# Patient Record
Sex: Female | Born: 1945
Health system: Southern US, Community
[De-identification: ages and names within clinical notes are randomized; demographics above are authoritative.]

## PROBLEM LIST (undated history)

## (undated) DIAGNOSIS — I10 Essential (primary) hypertension: Secondary | ICD-10-CM

## (undated) DIAGNOSIS — E039 Hypothyroidism, unspecified: Secondary | ICD-10-CM

## (undated) DIAGNOSIS — Z9289 Personal history of other medical treatment: Secondary | ICD-10-CM

## (undated) DIAGNOSIS — E7800 Pure hypercholesterolemia, unspecified: Secondary | ICD-10-CM

## (undated) HISTORY — DX: Hypothyroidism, unspecified: E03.9

## (undated) HISTORY — DX: Essential (primary) hypertension: I10

## (undated) HISTORY — DX: Pure hypercholesterolemia, unspecified: E78.00

## (undated) HISTORY — DX: Personal history of other medical treatment: Z92.89

## (undated) HISTORY — PX: OTHER SURGICAL HISTORY: SHX169

---

## 1999-02-18 ENCOUNTER — Other Ambulatory Visit: Admission: RE | Admit: 1999-02-18 | Discharge: 1999-02-18 | Payer: Self-pay | Admitting: *Deleted

## 2000-03-08 ENCOUNTER — Other Ambulatory Visit: Admission: RE | Admit: 2000-03-08 | Discharge: 2000-03-08 | Payer: Self-pay | Admitting: *Deleted

## 2001-03-28 ENCOUNTER — Other Ambulatory Visit: Admission: RE | Admit: 2001-03-28 | Discharge: 2001-03-28 | Payer: Self-pay | Admitting: *Deleted

## 2002-04-16 ENCOUNTER — Other Ambulatory Visit: Admission: RE | Admit: 2002-04-16 | Discharge: 2002-04-16 | Payer: Self-pay | Admitting: *Deleted

## 2003-11-11 ENCOUNTER — Other Ambulatory Visit: Admission: RE | Admit: 2003-11-11 | Discharge: 2003-11-11 | Payer: Self-pay | Admitting: *Deleted

## 2004-02-22 ENCOUNTER — Encounter: Admission: RE | Admit: 2004-02-22 | Discharge: 2004-02-22 | Payer: Self-pay | Admitting: Family Medicine

## 2004-11-22 ENCOUNTER — Other Ambulatory Visit: Admission: RE | Admit: 2004-11-22 | Discharge: 2004-11-22 | Payer: Self-pay | Admitting: *Deleted

## 2005-03-03 ENCOUNTER — Encounter: Admission: RE | Admit: 2005-03-03 | Discharge: 2005-03-03 | Payer: Self-pay | Admitting: Family Medicine

## 2006-11-19 IMAGING — CT CT CHEST W/ CM
1 series · 16 of 31 positions shown, 20 images · IV contrast (omnipaque 75cc)
Comparison: None.

CLINICAL DATA: Cough/shortness of breath with exertion.  
 CT CHEST W/CONTRAST:
TECHNIQUE: Multidetector CT imaging of the chest was performed following the standard protocol during bolus administration of intravenous contrast. 
 Contrast:  75cc Omnipaque 300.

[Series 2: — · axial · 0.70mm/px · z∈[-261,+14]mm · 16 of 61 slices shown, 20 images]
[im 3/61  mediastinal]
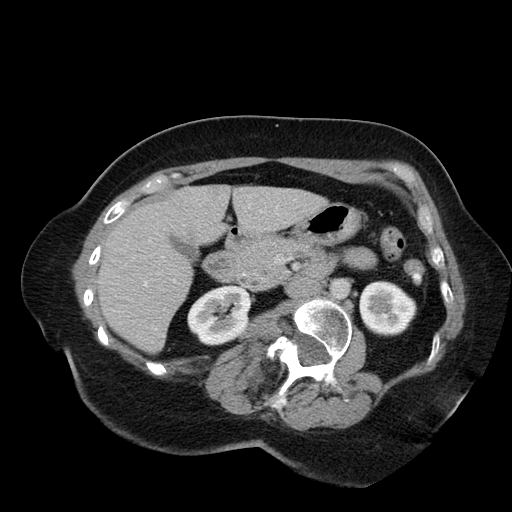
[im 3/61  lung]
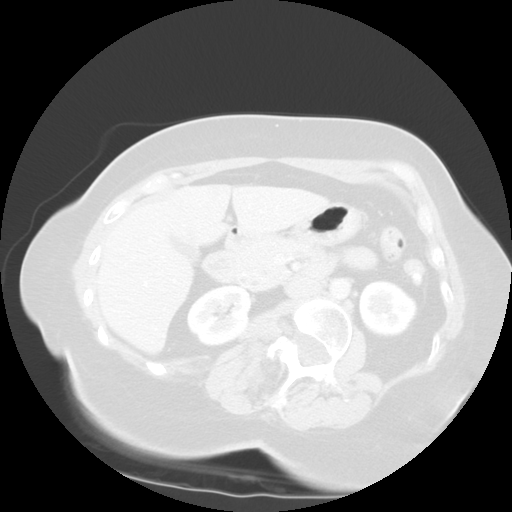
[im 7/61  lung]
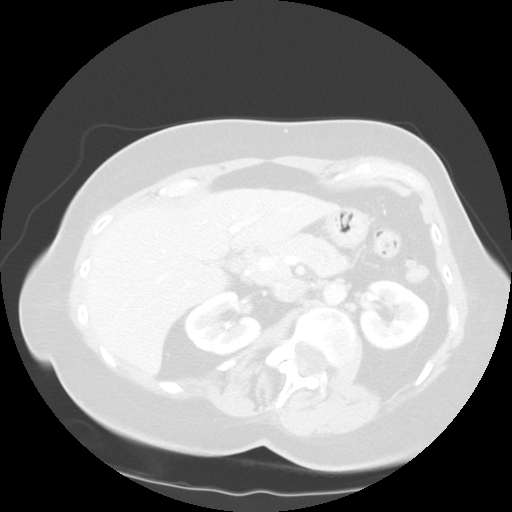
[im 12/61  lung]
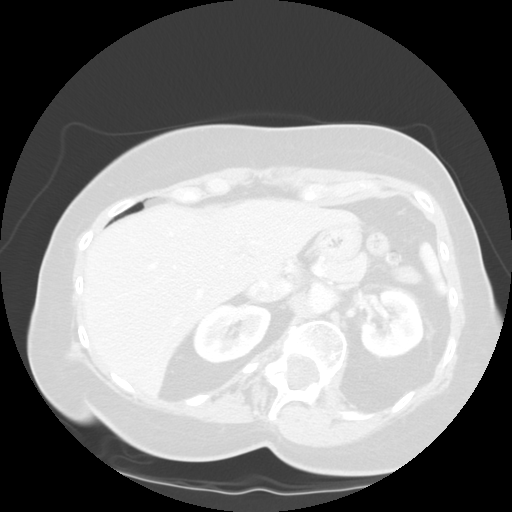
[im 14/61  lung]
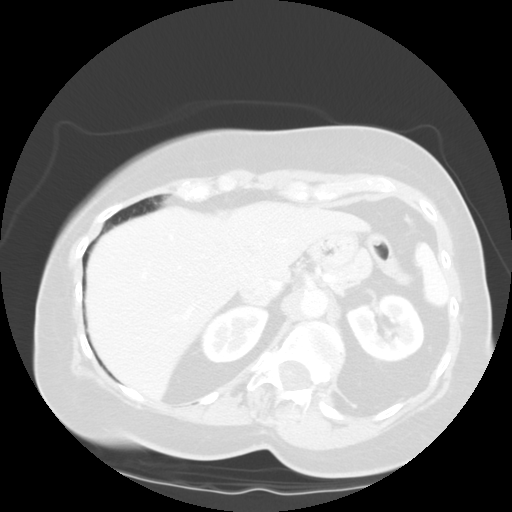
[im 18/61  mediastinal]
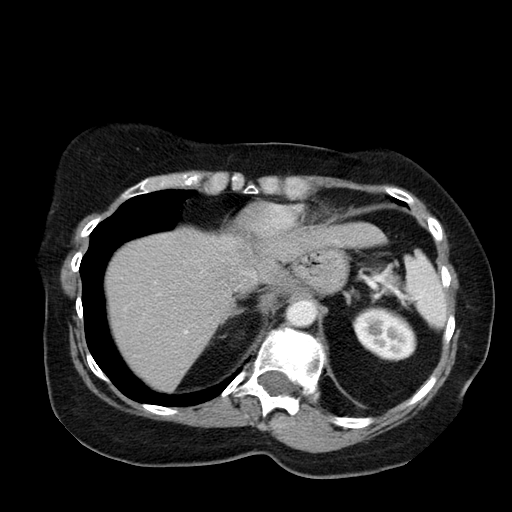
[im 18/61  lung]
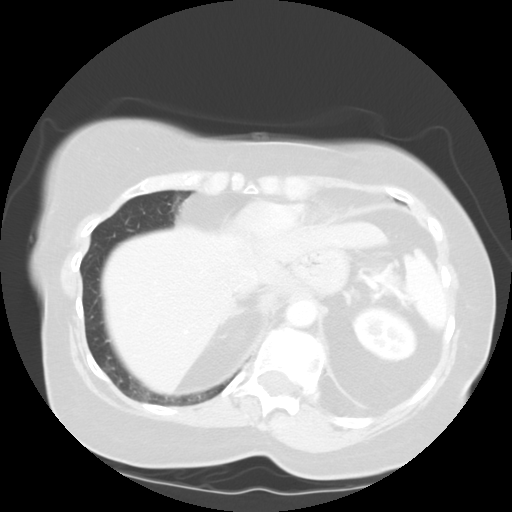
[im 21/61  lung]
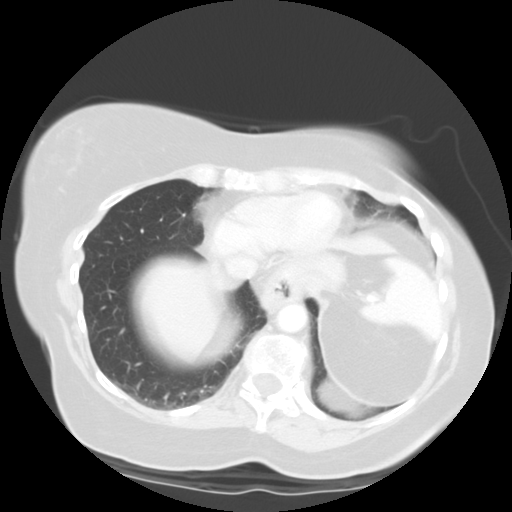
[im 25/61  lung]
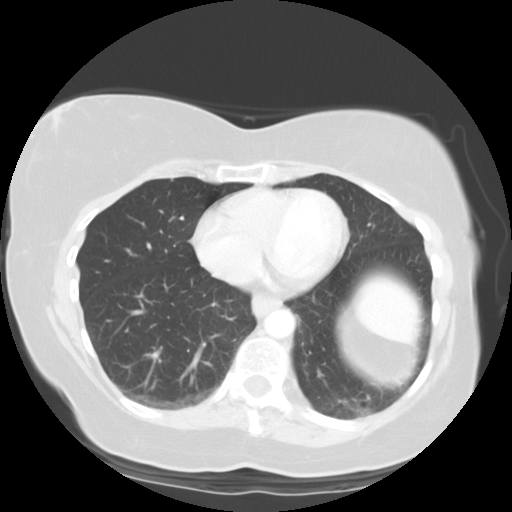
[im 29/61  lung]
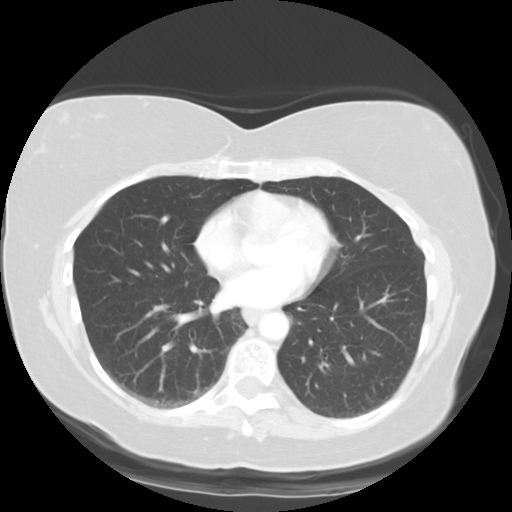
[im 33/61  mediastinal]
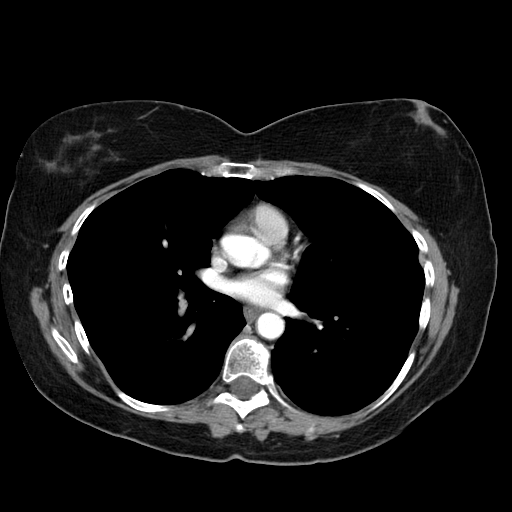
[im 33/61  lung]
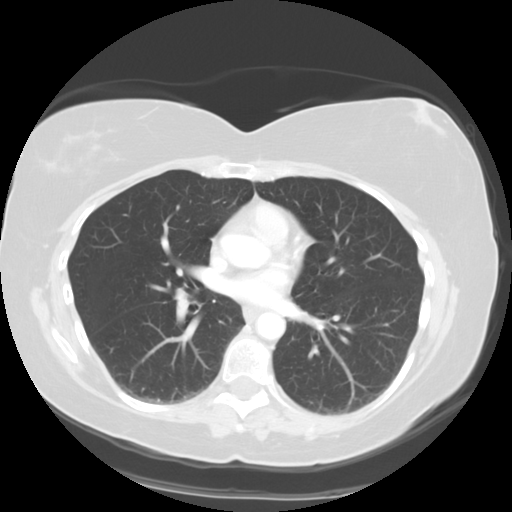
[im 36/61  lung]
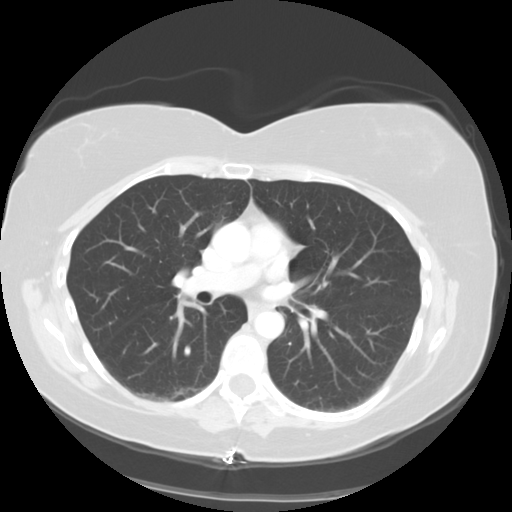
[im 38/61  lung]
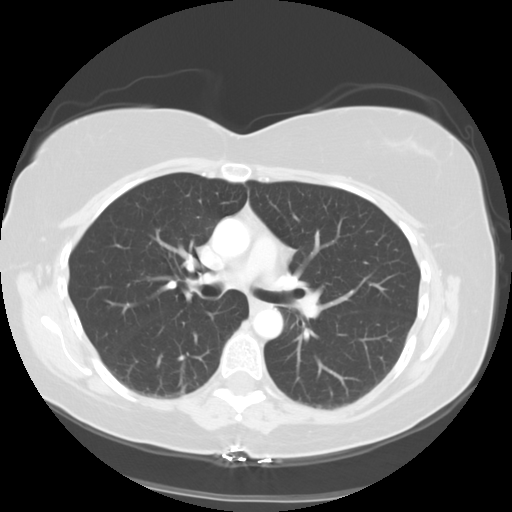
[im 41/61  lung]
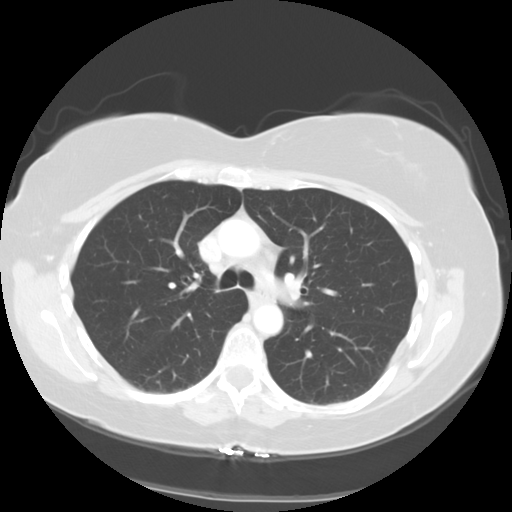
[im 45/61  mediastinal]
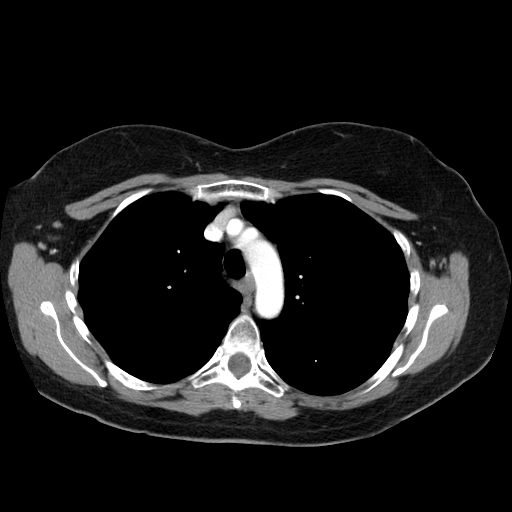
[im 45/61  lung]
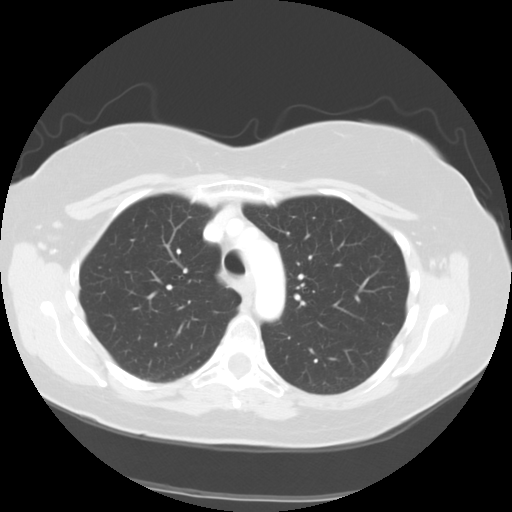
[im 49/61  lung]
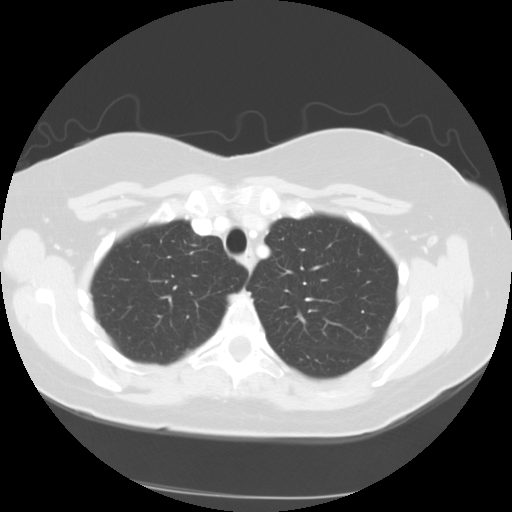
[im 54/61  lung]
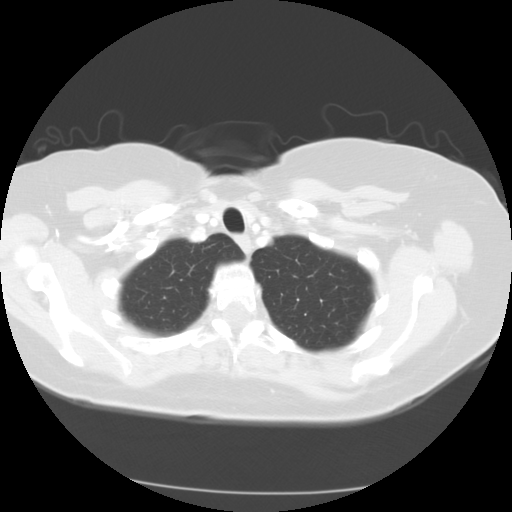
[im 58/61  lung]
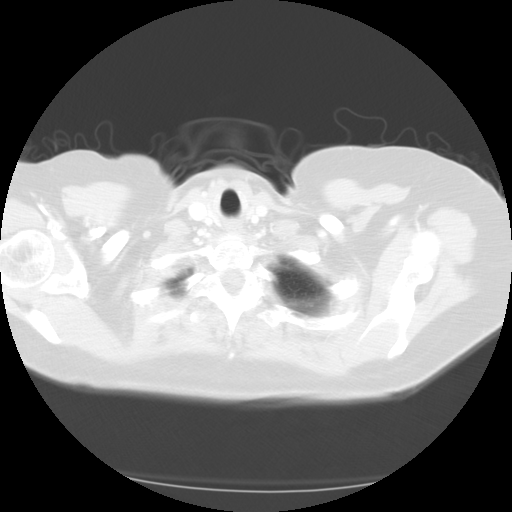

[16 of 31 positions shown; findings below may reference images not displayed]

FINDINGS: No active appearing lung infiltrates, nodules, or masses.  There is an elliptical fatty density posterior to the left hemidiaphragm at the left lung base.  This is likely a lipoma emanating off the diaphragm.  It is of homogeneous fat density and so I do not think it is significant.  
 There does appear to be some mild peribronchial thickening suggesting bronchitis. 
 No mediastinal adenopathy.  No pleural or pericardial fluid.
IMPRESSION: 1.  Mild bronchitic changes with no active air space disease or pleural fluid.  
 2.  Probable incidental lipoma emanating off the left hemidiaphragm.

## 2010-04-02 ENCOUNTER — Encounter: Payer: Self-pay | Admitting: Family Medicine

## 2011-10-31 DIAGNOSIS — Z1231 Encounter for screening mammogram for malignant neoplasm of breast: Secondary | ICD-10-CM | POA: Diagnosis not present

## 2011-11-20 DIAGNOSIS — E785 Hyperlipidemia, unspecified: Secondary | ICD-10-CM | POA: Diagnosis not present

## 2011-11-20 DIAGNOSIS — E039 Hypothyroidism, unspecified: Secondary | ICD-10-CM | POA: Diagnosis not present

## 2011-11-20 DIAGNOSIS — Z0000 Encounter for general adult medical examination without abnormal findings: Secondary | ICD-10-CM | POA: Diagnosis not present

## 2011-11-20 DIAGNOSIS — I10 Essential (primary) hypertension: Secondary | ICD-10-CM | POA: Diagnosis not present

## 2011-11-20 DIAGNOSIS — Z23 Encounter for immunization: Secondary | ICD-10-CM | POA: Diagnosis not present

## 2011-12-06 DIAGNOSIS — M25569 Pain in unspecified knee: Secondary | ICD-10-CM | POA: Diagnosis not present

## 2011-12-06 DIAGNOSIS — M47817 Spondylosis without myelopathy or radiculopathy, lumbosacral region: Secondary | ICD-10-CM | POA: Diagnosis not present

## 2011-12-06 DIAGNOSIS — M4120 Other idiopathic scoliosis, site unspecified: Secondary | ICD-10-CM | POA: Diagnosis not present

## 2011-12-06 DIAGNOSIS — M1610 Unilateral primary osteoarthritis, unspecified hip: Secondary | ICD-10-CM | POA: Diagnosis not present

## 2011-12-20 DIAGNOSIS — M1610 Unilateral primary osteoarthritis, unspecified hip: Secondary | ICD-10-CM | POA: Diagnosis not present

## 2012-01-17 DIAGNOSIS — D1801 Hemangioma of skin and subcutaneous tissue: Secondary | ICD-10-CM | POA: Diagnosis not present

## 2012-01-17 DIAGNOSIS — L821 Other seborrheic keratosis: Secondary | ICD-10-CM | POA: Diagnosis not present

## 2012-03-01 DIAGNOSIS — Z961 Presence of intraocular lens: Secondary | ICD-10-CM | POA: Diagnosis not present

## 2012-03-01 DIAGNOSIS — H26499 Other secondary cataract, unspecified eye: Secondary | ICD-10-CM | POA: Diagnosis not present

## 2012-03-01 DIAGNOSIS — H04129 Dry eye syndrome of unspecified lacrimal gland: Secondary | ICD-10-CM | POA: Diagnosis not present

## 2012-04-01 DIAGNOSIS — J069 Acute upper respiratory infection, unspecified: Secondary | ICD-10-CM | POA: Diagnosis not present

## 2012-04-01 DIAGNOSIS — J029 Acute pharyngitis, unspecified: Secondary | ICD-10-CM | POA: Diagnosis not present

## 2012-04-06 DIAGNOSIS — J209 Acute bronchitis, unspecified: Secondary | ICD-10-CM | POA: Diagnosis not present

## 2012-11-04 DIAGNOSIS — Z1231 Encounter for screening mammogram for malignant neoplasm of breast: Secondary | ICD-10-CM | POA: Diagnosis not present

## 2012-11-21 DIAGNOSIS — Z0000 Encounter for general adult medical examination without abnormal findings: Secondary | ICD-10-CM | POA: Diagnosis not present

## 2012-11-21 DIAGNOSIS — M899 Disorder of bone, unspecified: Secondary | ICD-10-CM | POA: Diagnosis not present

## 2012-11-21 DIAGNOSIS — E039 Hypothyroidism, unspecified: Secondary | ICD-10-CM | POA: Diagnosis not present

## 2012-11-21 DIAGNOSIS — E785 Hyperlipidemia, unspecified: Secondary | ICD-10-CM | POA: Diagnosis not present

## 2012-11-28 DIAGNOSIS — Z23 Encounter for immunization: Secondary | ICD-10-CM | POA: Diagnosis not present

## 2012-11-28 DIAGNOSIS — E785 Hyperlipidemia, unspecified: Secondary | ICD-10-CM | POA: Diagnosis not present

## 2012-11-28 DIAGNOSIS — M899 Disorder of bone, unspecified: Secondary | ICD-10-CM | POA: Diagnosis not present

## 2012-11-28 DIAGNOSIS — E039 Hypothyroidism, unspecified: Secondary | ICD-10-CM | POA: Diagnosis not present

## 2012-11-28 DIAGNOSIS — M8580 Other specified disorders of bone density and structure, unspecified site: Secondary | ICD-10-CM | POA: Insufficient documentation

## 2012-11-28 DIAGNOSIS — I10 Essential (primary) hypertension: Secondary | ICD-10-CM | POA: Diagnosis not present

## 2012-11-28 DIAGNOSIS — Z0000 Encounter for general adult medical examination without abnormal findings: Secondary | ICD-10-CM | POA: Diagnosis not present

## 2013-01-31 DIAGNOSIS — Z8262 Family history of osteoporosis: Secondary | ICD-10-CM | POA: Diagnosis not present

## 2013-01-31 DIAGNOSIS — M899 Disorder of bone, unspecified: Secondary | ICD-10-CM | POA: Diagnosis not present

## 2013-05-13 DIAGNOSIS — J309 Allergic rhinitis, unspecified: Secondary | ICD-10-CM | POA: Diagnosis not present

## 2013-05-13 DIAGNOSIS — J320 Chronic maxillary sinusitis: Secondary | ICD-10-CM | POA: Diagnosis not present

## 2013-10-06 DIAGNOSIS — J209 Acute bronchitis, unspecified: Secondary | ICD-10-CM | POA: Diagnosis not present

## 2013-11-06 DIAGNOSIS — Z1231 Encounter for screening mammogram for malignant neoplasm of breast: Secondary | ICD-10-CM | POA: Diagnosis not present

## 2013-11-28 DIAGNOSIS — I10 Essential (primary) hypertension: Secondary | ICD-10-CM | POA: Diagnosis not present

## 2013-11-28 DIAGNOSIS — E039 Hypothyroidism, unspecified: Secondary | ICD-10-CM | POA: Diagnosis not present

## 2013-11-28 DIAGNOSIS — E559 Vitamin D deficiency, unspecified: Secondary | ICD-10-CM | POA: Diagnosis not present

## 2013-11-28 DIAGNOSIS — E785 Hyperlipidemia, unspecified: Secondary | ICD-10-CM | POA: Diagnosis not present

## 2013-11-28 DIAGNOSIS — Z23 Encounter for immunization: Secondary | ICD-10-CM | POA: Diagnosis not present

## 2014-04-13 DIAGNOSIS — J209 Acute bronchitis, unspecified: Secondary | ICD-10-CM | POA: Diagnosis not present

## 2014-05-29 DIAGNOSIS — R8290 Unspecified abnormal findings in urine: Secondary | ICD-10-CM | POA: Diagnosis not present

## 2014-05-29 DIAGNOSIS — I10 Essential (primary) hypertension: Secondary | ICD-10-CM | POA: Diagnosis not present

## 2014-05-29 DIAGNOSIS — E785 Hyperlipidemia, unspecified: Secondary | ICD-10-CM | POA: Diagnosis not present

## 2014-05-29 DIAGNOSIS — E039 Hypothyroidism, unspecified: Secondary | ICD-10-CM | POA: Diagnosis not present

## 2014-05-29 DIAGNOSIS — E559 Vitamin D deficiency, unspecified: Secondary | ICD-10-CM | POA: Diagnosis not present

## 2014-05-29 DIAGNOSIS — Z1211 Encounter for screening for malignant neoplasm of colon: Secondary | ICD-10-CM | POA: Diagnosis not present

## 2014-05-29 DIAGNOSIS — Z0000 Encounter for general adult medical examination without abnormal findings: Secondary | ICD-10-CM | POA: Diagnosis not present

## 2014-11-13 DIAGNOSIS — Z1231 Encounter for screening mammogram for malignant neoplasm of breast: Secondary | ICD-10-CM | POA: Diagnosis not present

## 2014-12-22 DIAGNOSIS — Z23 Encounter for immunization: Secondary | ICD-10-CM | POA: Diagnosis not present

## 2015-02-15 DIAGNOSIS — M85852 Other specified disorders of bone density and structure, left thigh: Secondary | ICD-10-CM | POA: Diagnosis not present

## 2015-06-03 DIAGNOSIS — E039 Hypothyroidism, unspecified: Secondary | ICD-10-CM | POA: Diagnosis not present

## 2015-06-03 DIAGNOSIS — G479 Sleep disorder, unspecified: Secondary | ICD-10-CM | POA: Diagnosis not present

## 2015-06-03 DIAGNOSIS — M2550 Pain in unspecified joint: Secondary | ICD-10-CM | POA: Diagnosis not present

## 2015-06-03 DIAGNOSIS — I10 Essential (primary) hypertension: Secondary | ICD-10-CM | POA: Diagnosis not present

## 2015-06-03 DIAGNOSIS — E785 Hyperlipidemia, unspecified: Secondary | ICD-10-CM | POA: Diagnosis not present

## 2015-06-03 DIAGNOSIS — R5383 Other fatigue: Secondary | ICD-10-CM | POA: Diagnosis not present

## 2015-06-03 DIAGNOSIS — Z0000 Encounter for general adult medical examination without abnormal findings: Secondary | ICD-10-CM | POA: Diagnosis not present

## 2015-06-03 DIAGNOSIS — M8580 Other specified disorders of bone density and structure, unspecified site: Secondary | ICD-10-CM | POA: Diagnosis not present

## 2015-06-03 DIAGNOSIS — E559 Vitamin D deficiency, unspecified: Secondary | ICD-10-CM | POA: Diagnosis not present

## 2015-11-04 DIAGNOSIS — H524 Presbyopia: Secondary | ICD-10-CM | POA: Diagnosis not present

## 2015-11-04 DIAGNOSIS — H26493 Other secondary cataract, bilateral: Secondary | ICD-10-CM | POA: Diagnosis not present

## 2015-11-04 DIAGNOSIS — Z961 Presence of intraocular lens: Secondary | ICD-10-CM | POA: Diagnosis not present

## 2015-11-04 DIAGNOSIS — H04129 Dry eye syndrome of unspecified lacrimal gland: Secondary | ICD-10-CM | POA: Diagnosis not present

## 2015-11-19 DIAGNOSIS — Z1231 Encounter for screening mammogram for malignant neoplasm of breast: Secondary | ICD-10-CM | POA: Diagnosis not present

## 2015-11-19 DIAGNOSIS — Z803 Family history of malignant neoplasm of breast: Secondary | ICD-10-CM | POA: Diagnosis not present

## 2016-06-08 DIAGNOSIS — E785 Hyperlipidemia, unspecified: Secondary | ICD-10-CM | POA: Diagnosis not present

## 2016-06-08 DIAGNOSIS — E559 Vitamin D deficiency, unspecified: Secondary | ICD-10-CM | POA: Diagnosis not present

## 2016-06-08 DIAGNOSIS — Z0000 Encounter for general adult medical examination without abnormal findings: Secondary | ICD-10-CM | POA: Diagnosis not present

## 2016-06-08 DIAGNOSIS — W57XXXA Bitten or stung by nonvenomous insect and other nonvenomous arthropods, initial encounter: Secondary | ICD-10-CM | POA: Diagnosis not present

## 2016-06-08 DIAGNOSIS — R5383 Other fatigue: Secondary | ICD-10-CM | POA: Diagnosis not present

## 2016-06-08 DIAGNOSIS — E039 Hypothyroidism, unspecified: Secondary | ICD-10-CM | POA: Diagnosis not present

## 2016-06-08 DIAGNOSIS — I10 Essential (primary) hypertension: Secondary | ICD-10-CM | POA: Diagnosis not present

## 2016-06-08 DIAGNOSIS — M8580 Other specified disorders of bone density and structure, unspecified site: Secondary | ICD-10-CM | POA: Diagnosis not present

## 2016-06-08 DIAGNOSIS — M2550 Pain in unspecified joint: Secondary | ICD-10-CM | POA: Diagnosis not present

## 2016-08-11 DIAGNOSIS — M7752 Other enthesopathy of left foot: Secondary | ICD-10-CM | POA: Diagnosis not present

## 2016-08-11 DIAGNOSIS — M7751 Other enthesopathy of right foot: Secondary | ICD-10-CM | POA: Diagnosis not present

## 2016-08-11 DIAGNOSIS — G5761 Lesion of plantar nerve, right lower limb: Secondary | ICD-10-CM | POA: Diagnosis not present

## 2016-08-11 DIAGNOSIS — M19071 Primary osteoarthritis, right ankle and foot: Secondary | ICD-10-CM | POA: Diagnosis not present

## 2016-08-11 DIAGNOSIS — G5762 Lesion of plantar nerve, left lower limb: Secondary | ICD-10-CM | POA: Diagnosis not present

## 2016-08-11 DIAGNOSIS — M19072 Primary osteoarthritis, left ankle and foot: Secondary | ICD-10-CM | POA: Diagnosis not present

## 2016-08-17 DIAGNOSIS — R112 Nausea with vomiting, unspecified: Secondary | ICD-10-CM | POA: Diagnosis not present

## 2016-08-21 DIAGNOSIS — G5762 Lesion of plantar nerve, left lower limb: Secondary | ICD-10-CM | POA: Diagnosis not present

## 2016-08-21 DIAGNOSIS — G5761 Lesion of plantar nerve, right lower limb: Secondary | ICD-10-CM | POA: Diagnosis not present

## 2016-08-21 DIAGNOSIS — M7752 Other enthesopathy of left foot: Secondary | ICD-10-CM | POA: Diagnosis not present

## 2016-08-21 DIAGNOSIS — M7751 Other enthesopathy of right foot: Secondary | ICD-10-CM | POA: Diagnosis not present

## 2016-08-28 DIAGNOSIS — M7752 Other enthesopathy of left foot: Secondary | ICD-10-CM | POA: Diagnosis not present

## 2016-08-28 DIAGNOSIS — G5762 Lesion of plantar nerve, left lower limb: Secondary | ICD-10-CM | POA: Diagnosis not present

## 2016-08-28 DIAGNOSIS — G5761 Lesion of plantar nerve, right lower limb: Secondary | ICD-10-CM | POA: Diagnosis not present

## 2016-08-28 DIAGNOSIS — M7751 Other enthesopathy of right foot: Secondary | ICD-10-CM | POA: Diagnosis not present

## 2016-09-11 DIAGNOSIS — G5760 Lesion of plantar nerve, unspecified lower limb: Secondary | ICD-10-CM | POA: Diagnosis not present

## 2016-09-11 DIAGNOSIS — M7751 Other enthesopathy of right foot: Secondary | ICD-10-CM | POA: Diagnosis not present

## 2016-09-11 DIAGNOSIS — M7752 Other enthesopathy of left foot: Secondary | ICD-10-CM | POA: Diagnosis not present

## 2016-09-21 ENCOUNTER — Encounter: Payer: Self-pay | Admitting: Podiatry

## 2016-09-21 ENCOUNTER — Ambulatory Visit (INDEPENDENT_AMBULATORY_CARE_PROVIDER_SITE_OTHER): Payer: PPO | Admitting: Podiatry

## 2016-09-21 VITALS — BP 154/78 | HR 96 | Ht 62.0 in | Wt 155.0 lb

## 2016-09-21 DIAGNOSIS — M2010 Hallux valgus (acquired), unspecified foot: Secondary | ICD-10-CM | POA: Diagnosis not present

## 2016-09-21 DIAGNOSIS — M21619 Bunion of unspecified foot: Secondary | ICD-10-CM

## 2016-09-21 DIAGNOSIS — L97501 Non-pressure chronic ulcer of other part of unspecified foot limited to breakdown of skin: Secondary | ICD-10-CM

## 2016-09-21 NOTE — Patient Instructions (Signed)
Seen for painful bunion on left foot. Reviewed findings and available treatment options. May benefit from [facility] and hammer toe surgery 2nd left. All lesions, pre ulcerative lesion 2nd right, and under the ball of left foot debrided and padded. Return as needed.

## 2016-09-21 NOTE — Progress Notes (Signed)
SUBJECTIVE: 71 y.o. year old female presents with painful bunion L>R. Pain for the last 2 months. Had bunion for many years. Was doing well until last 2 months ago. Been treated with injection for Neuroma pain on left foot.  REVIEW OF SYSTEMS: Pertinent items noted in HPI and remainder of comprehensive ROS otherwise negative.  OBJECTIVE: DERMATOLOGIC EXAMINATION: Thick discolored uneven nails x 10. Ulcerating digital corn 2nd IPJ medial right foot. Preulcerative callus over the left foot bunion. Severe plantar porokeratotic lesions under ball of left foot.   VASCULAR EXAMINATION OF LOWER LIMBS: All pedal pulses are palpable with normal pulsation.  Capillary Filling times within 3 seconds in all digits.  No edema or erythema noted. Temperature gradient from tibial crest to dorsum of foot is within normal bilateral.  NEUROLOGIC EXAMINATION OF THE LOWER LIMBS: All epicritic and tactile sensations grossly intact. Sharp and Dull discriminatory sensations at the plantar ball of hallux is intact bilateral.   MUSCULOSKELETAL EXAMINATION: Positive for severe hallux valgus with bunion deformity Left with pre ulcerative skin breakdown over the bunion area. Subluxed first MPJ left foot. Severe digital contracture 2nd left that overlaps with the great toe. Mild bunion deformity right.  RADIOGRAPHIC STUDIES from Dr. Kerri Perches show:  AP View:  Generalized, loss of bone mass in forefoot and rearfoot.  Severe valgus rotation of the great toe, subluxed first MPJ with severe medial deviation of the first metatarsal bone. Overlapping first and 2nd digits. Lateral view:  Displaced first ray, severe forefoot digital deformities. No gross deviation of rearfoot.   ASSESSMENT: Severe HAV with bunion, with ulcerating skin left foot. Ulcerating digital corn 2nd right. Mycotic nails x 10. Porokeratosis plantar left foot. Pain with ambulation left.  PLAN: Reviewed findings and available treatment  options, shoe with opening over bunion area, and surgical option without guarantee of success. All lesions debrided padded, 2nd right, over bunion left. All nails debrided.  Patient will return when she is ready to have surgery on left bunion and deformed 2nd toe.

## 2016-09-25 DIAGNOSIS — I10 Essential (primary) hypertension: Secondary | ICD-10-CM | POA: Diagnosis not present

## 2016-09-25 DIAGNOSIS — M79671 Pain in right foot: Secondary | ICD-10-CM | POA: Diagnosis not present

## 2016-09-25 DIAGNOSIS — B353 Tinea pedis: Secondary | ICD-10-CM | POA: Diagnosis not present

## 2016-09-25 DIAGNOSIS — M2010 Hallux valgus (acquired), unspecified foot: Secondary | ICD-10-CM | POA: Diagnosis not present

## 2016-09-25 DIAGNOSIS — M2042 Other hammer toe(s) (acquired), left foot: Secondary | ICD-10-CM | POA: Diagnosis not present

## 2016-10-12 ENCOUNTER — Ambulatory Visit (INDEPENDENT_AMBULATORY_CARE_PROVIDER_SITE_OTHER): Payer: PPO

## 2016-10-12 ENCOUNTER — Encounter: Payer: Self-pay | Admitting: Podiatry

## 2016-10-12 ENCOUNTER — Ambulatory Visit (INDEPENDENT_AMBULATORY_CARE_PROVIDER_SITE_OTHER): Payer: PPO | Admitting: Podiatry

## 2016-10-12 DIAGNOSIS — E039 Hypothyroidism, unspecified: Secondary | ICD-10-CM | POA: Insufficient documentation

## 2016-10-12 DIAGNOSIS — M2010 Hallux valgus (acquired), unspecified foot: Secondary | ICD-10-CM

## 2016-10-12 DIAGNOSIS — E785 Hyperlipidemia, unspecified: Secondary | ICD-10-CM | POA: Insufficient documentation

## 2016-10-12 DIAGNOSIS — I10 Essential (primary) hypertension: Secondary | ICD-10-CM | POA: Insufficient documentation

## 2016-10-12 MED ORDER — DICLOFENAC SODIUM 1 % TD GEL: 4.0000 g | Freq: Four times a day (QID) | TRANSDERMAL | 3 refills | Status: DC

## 2016-10-12 NOTE — Progress Notes (Signed)
   Subjective:    Patient ID: Terri Mcgee, female    DOB: 1945/07/03, 71 y.o.   MRN: [id number]  HPI: She presents today chief complaint of a painful callus area to the lateral aspect of the fifth metatarsal head of the right foot. She states that wearing her shoes are uncomfortable causing this area and they're also painful with the bunion deformity on the contralateral foot. She states that her skin is thin.    Review of Systems  Musculoskeletal: Positive for arthralgias and back pain.  Skin: Positive for rash.  All other systems reviewed and are negative.      Objective:   Physical Exam: Vital signs are stable alert and oriented 3. Pulses are palpable. Neurologic exam is intact. Due to reflex are intact. Muscle strength is normal bilateral. Orthopedic evaluation demonstrates severe bunion deformity with overlapping hammertoe deformity rigid in nature with slight diminished vascular spine with elevation of the left foot. This does concern me about anyone performing any type of surgical procedure there were pulses are palpable is obvious that she has diminished Fill time and Flovent. The right foot however demonstrate erectus foot she does have tailor's bunion deformity with overlying reactive hyperkeratotic lesion. She has severe fat pad atrophy.        Assessment & Plan:  Assessment: Tailor's bunion deformity with callus lateral aspect of the fifth metatarsal right foot. Severe hammertoe deformity to be deformity left foot.  Plan: Discussed etiology pathology concerning for surgical therapies at this point I expressed to her that surgery should be not considered as a primary resort at this point in time I recommended highly that she consider simple debridement and padding. So we discussed this in detail today show extensive cement tool follow up with [place] as needed.

## 2016-11-06 DIAGNOSIS — Z961 Presence of intraocular lens: Secondary | ICD-10-CM | POA: Diagnosis not present

## 2016-11-06 DIAGNOSIS — H26493 Other secondary cataract, bilateral: Secondary | ICD-10-CM | POA: Diagnosis not present

## 2016-11-06 DIAGNOSIS — H524 Presbyopia: Secondary | ICD-10-CM | POA: Diagnosis not present

## 2016-11-06 DIAGNOSIS — H43393 Other vitreous opacities, bilateral: Secondary | ICD-10-CM | POA: Diagnosis not present

## 2017-06-12 DIAGNOSIS — E785 Hyperlipidemia, unspecified: Secondary | ICD-10-CM | POA: Diagnosis not present

## 2017-06-12 DIAGNOSIS — I10 Essential (primary) hypertension: Secondary | ICD-10-CM | POA: Diagnosis not present

## 2017-06-12 DIAGNOSIS — E039 Hypothyroidism, unspecified: Secondary | ICD-10-CM | POA: Diagnosis not present

## 2017-06-12 DIAGNOSIS — Z23 Encounter for immunization: Secondary | ICD-10-CM | POA: Diagnosis not present

## 2017-06-12 DIAGNOSIS — Z1159 Encounter for screening for other viral diseases: Secondary | ICD-10-CM | POA: Diagnosis not present

## 2017-06-12 DIAGNOSIS — Z0000 Encounter for general adult medical examination without abnormal findings: Secondary | ICD-10-CM | POA: Diagnosis not present

## 2017-06-12 DIAGNOSIS — Z1389 Encounter for screening for other disorder: Secondary | ICD-10-CM | POA: Diagnosis not present

## 2017-06-12 DIAGNOSIS — E663 Overweight: Secondary | ICD-10-CM | POA: Diagnosis not present

## 2017-06-12 DIAGNOSIS — Z1211 Encounter for screening for malignant neoplasm of colon: Secondary | ICD-10-CM | POA: Diagnosis not present

## 2017-06-12 DIAGNOSIS — M8580 Other specified disorders of bone density and structure, unspecified site: Secondary | ICD-10-CM | POA: Diagnosis not present

## 2017-06-18 DIAGNOSIS — Z1231 Encounter for screening mammogram for malignant neoplasm of breast: Secondary | ICD-10-CM | POA: Diagnosis not present

## 2018-02-13 DIAGNOSIS — H04129 Dry eye syndrome of unspecified lacrimal gland: Secondary | ICD-10-CM | POA: Diagnosis not present

## 2018-02-13 DIAGNOSIS — Z961 Presence of intraocular lens: Secondary | ICD-10-CM | POA: Diagnosis not present

## 2018-02-13 DIAGNOSIS — H43393 Other vitreous opacities, bilateral: Secondary | ICD-10-CM | POA: Diagnosis not present

## 2018-02-13 DIAGNOSIS — H26493 Other secondary cataract, bilateral: Secondary | ICD-10-CM | POA: Diagnosis not present

## 2018-04-03 DIAGNOSIS — J0100 Acute maxillary sinusitis, unspecified: Secondary | ICD-10-CM | POA: Diagnosis not present

## 2018-04-03 DIAGNOSIS — R05 Cough: Secondary | ICD-10-CM | POA: Diagnosis not present

## 2018-04-03 DIAGNOSIS — J040 Acute laryngitis: Secondary | ICD-10-CM | POA: Diagnosis not present

## 2018-07-10 DIAGNOSIS — M8580 Other specified disorders of bone density and structure, unspecified site: Secondary | ICD-10-CM | POA: Diagnosis not present

## 2018-07-10 DIAGNOSIS — E039 Hypothyroidism, unspecified: Secondary | ICD-10-CM | POA: Diagnosis not present

## 2018-07-10 DIAGNOSIS — I10 Essential (primary) hypertension: Secondary | ICD-10-CM | POA: Diagnosis not present

## 2018-07-10 DIAGNOSIS — E785 Hyperlipidemia, unspecified: Secondary | ICD-10-CM | POA: Diagnosis not present

## 2018-12-12 DIAGNOSIS — E039 Hypothyroidism, unspecified: Secondary | ICD-10-CM | POA: Diagnosis not present

## 2018-12-12 DIAGNOSIS — Z0000 Encounter for general adult medical examination without abnormal findings: Secondary | ICD-10-CM | POA: Diagnosis not present

## 2018-12-12 DIAGNOSIS — Z1211 Encounter for screening for malignant neoplasm of colon: Secondary | ICD-10-CM | POA: Diagnosis not present

## 2018-12-12 DIAGNOSIS — I10 Essential (primary) hypertension: Secondary | ICD-10-CM | POA: Diagnosis not present

## 2018-12-12 DIAGNOSIS — E785 Hyperlipidemia, unspecified: Secondary | ICD-10-CM | POA: Diagnosis not present

## 2019-03-14 DIAGNOSIS — Z9289 Personal history of other medical treatment: Secondary | ICD-10-CM

## 2019-03-14 HISTORY — DX: Personal history of other medical treatment: Z92.89

## 2019-03-20 DIAGNOSIS — H26493 Other secondary cataract, bilateral: Secondary | ICD-10-CM | POA: Diagnosis not present

## 2019-03-20 DIAGNOSIS — H43393 Other vitreous opacities, bilateral: Secondary | ICD-10-CM | POA: Diagnosis not present

## 2019-03-20 DIAGNOSIS — H1045 Other chronic allergic conjunctivitis: Secondary | ICD-10-CM | POA: Diagnosis not present

## 2019-03-20 DIAGNOSIS — H04123 Dry eye syndrome of bilateral lacrimal glands: Secondary | ICD-10-CM | POA: Diagnosis not present

## 2019-05-04 ENCOUNTER — Ambulatory Visit: Payer: PPO | Attending: Internal Medicine

## 2019-05-04 DIAGNOSIS — Z23 Encounter for immunization: Secondary | ICD-10-CM | POA: Insufficient documentation

## 2019-05-04 NOTE — Progress Notes (Signed)
   Covid-19 Vaccination Clinic  Name:  Terri Mcgee    MRN: [id number] DOB: 10-26-1945  05/04/2019  [name] was observed post Covid-19 immunization for 15 minutes without incidence. She was provided with Vaccine Information Sheet and instruction to access the V-Safe system.   [name] was instructed to call 911 with any severe reactions post vaccine: Marland Kitchen Difficulty breathing  . Swelling of your face and throat  . A fast heartbeat  . A bad rash all over your body  . Dizziness and weakness    Immunizations Administered    Name Date Dose VIS Date Route   Pfizer COVID-19 Vaccine 05/04/2019 11:58 AM 0.3 mL 02/21/2019 Intramuscular   Manufacturer: [place]   Lot: [id number]   [place]: [id number]

## 2019-05-28 ENCOUNTER — Ambulatory Visit: Payer: PPO | Attending: Internal Medicine

## 2019-05-28 DIAGNOSIS — Z23 Encounter for immunization: Secondary | ICD-10-CM

## 2019-05-28 NOTE — Progress Notes (Signed)
   Covid-19 Vaccination Clinic  Name:  Terri Mcgee    MRN: [id number] DOB: 26-May-1945  05/28/2019  Terri Mcgee was observed post Covid-19 immunization for 15 minutes without incident. She was provided with Vaccine Information Sheet and instruction to access the V-Safe system.   Terri Mcgee was instructed to call 911 with any severe reactions post vaccine: Marland Kitchen Difficulty breathing  . Swelling of face and throat  . A fast heartbeat  . A bad rash all over body  . Dizziness and weakness   Immunizations Administered    Name Date Dose VIS Date Route   Pfizer COVID-19 Vaccine 05/28/2019 10:47 AM 0.3 mL 02/21/2019 Intramuscular   Manufacturer: [place]   Lot: [id number]   [place]: [id number]

## 2019-12-15 DIAGNOSIS — Z1231 Encounter for screening mammogram for malignant neoplasm of breast: Secondary | ICD-10-CM | POA: Diagnosis not present

## 2020-02-07 DIAGNOSIS — S50862D Insect bite (nonvenomous) of left forearm, subsequent encounter: Secondary | ICD-10-CM | POA: Diagnosis not present

## 2020-03-16 ENCOUNTER — Ambulatory Visit (INDEPENDENT_AMBULATORY_CARE_PROVIDER_SITE_OTHER): Payer: PPO | Admitting: Orthopedic Surgery

## 2020-03-16 ENCOUNTER — Other Ambulatory Visit: Payer: Self-pay

## 2020-03-16 ENCOUNTER — Encounter: Payer: Self-pay | Admitting: Orthopedic Surgery

## 2020-03-16 VITALS — BP 128/76 | HR 78 | Temp 96.8°F | Ht 61.0 in | Wt 151.6 lb

## 2020-03-16 DIAGNOSIS — M25551 Pain in right hip: Secondary | ICD-10-CM | POA: Diagnosis not present

## 2020-03-16 DIAGNOSIS — F419 Anxiety disorder, unspecified: Secondary | ICD-10-CM | POA: Diagnosis not present

## 2020-03-16 DIAGNOSIS — F329 Major depressive disorder, single episode, unspecified: Secondary | ICD-10-CM | POA: Diagnosis not present

## 2020-03-16 DIAGNOSIS — E7800 Pure hypercholesterolemia, unspecified: Secondary | ICD-10-CM | POA: Diagnosis not present

## 2020-03-16 DIAGNOSIS — I10 Essential (primary) hypertension: Secondary | ICD-10-CM | POA: Diagnosis not present

## 2020-03-16 DIAGNOSIS — G8929 Other chronic pain: Secondary | ICD-10-CM

## 2020-03-16 DIAGNOSIS — E039 Hypothyroidism, unspecified: Secondary | ICD-10-CM

## 2020-03-16 DIAGNOSIS — F4321 Adjustment disorder with depressed mood: Secondary | ICD-10-CM

## 2020-03-16 DIAGNOSIS — M545 Low back pain, unspecified: Secondary | ICD-10-CM | POA: Diagnosis not present

## 2020-03-16 LAB — CBC WITH DIFFERENTIAL/PLATELET
Hemoglobin: 16.1 g/dL — ABNORMAL HIGH (ref 11.7–15.5)
MCHC: 33.2 g/dL (ref 32.0–36.0)
WBC: 7.1 10*3/uL (ref 3.8–10.8)

## 2020-03-16 MED ORDER — LISINOPRIL-HYDROCHLOROTHIAZIDE 10-12.5 MG PO TABS
1.0000 | ORAL_TABLET | Freq: Every day | ORAL | 1 refills | Status: DC
Start: 2020-03-16 — End: 2020-09-21

## 2020-03-16 MED ORDER — LEVOTHYROXINE SODIUM 100 MCG PO TABS
100.0000 ug | ORAL_TABLET | Freq: Every day | ORAL | 1 refills | Status: DC
Start: 2020-03-16 — End: 2020-04-28

## 2020-03-16 MED ORDER — SIMVASTATIN 20 MG PO TABS: 20.0000 mg | ORAL_TABLET | Freq: Every day | ORAL | 1 refills | Status: DC

## 2020-03-16 MED ORDER — SERTRALINE HCL 50 MG PO TABS: 50.0000 mg | ORAL_TABLET | Freq: Every day | ORAL | 1 refills | Status: DC

## 2020-03-16 NOTE — Patient Instructions (Addendum)
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
## 2020-03-17 LAB — CBC WITH DIFFERENTIAL/PLATELET
Absolute Monocytes: 540 cells/uL (ref 200–950)
Basophils Absolute: 43 cells/uL (ref 0–200)
Basophils Relative: 0.6 %
Eosinophils Absolute: 50 cells/uL (ref 15–500)
Eosinophils Relative: 0.7 %
HCT: 48.5 % — ABNORMAL HIGH (ref 35.0–45.0)
Lymphs Abs: 2080 cells/uL (ref 850–3900)
MCH: 31.6 pg (ref 27.0–33.0)
MCV: 95.1 fL (ref 80.0–100.0)
MPV: 11.2 fL (ref 7.5–12.5)
Monocytes Relative: 7.6 %
Neutro Abs: 4388 cells/uL (ref 1500–7800)
Neutrophils Relative %: 61.8 %
Platelets: 205 10*3/uL (ref 140–400)
RBC: 5.1 10*6/uL (ref 3.80–5.10)
RDW: 13 % (ref 11.0–15.0)
Total Lymphocyte: 29.3 %

## 2020-03-17 LAB — COMPREHENSIVE METABOLIC PANEL
AG Ratio: 1.7 (calc) (ref 1.0–2.5)
ALT: 17 U/L (ref 6–29)
AST: 18 U/L (ref 10–35)
Albumin: 4.3 g/dL (ref 3.6–5.1)
Alkaline phosphatase (APISO): 78 U/L (ref 37–153)
BUN: 24 mg/dL (ref 7–25)
CO2: 28 mmol/L (ref 20–32)
Calcium: 9.8 mg/dL (ref 8.6–10.4)
Chloride: 102 mmol/L (ref 98–110)
Creat: 0.81 mg/dL (ref 0.60–0.93)
Globulin: 2.5 g/dL (calc) (ref 1.9–3.7)
Glucose, Bld: 96 mg/dL (ref 65–139)
Potassium: 4.1 mmol/L (ref 3.5–5.3)
Sodium: 137 mmol/L (ref 135–146)
Total Bilirubin: 0.3 mg/dL (ref 0.2–1.2)
Total Protein: 6.8 g/dL (ref 6.1–8.1)

## 2020-03-17 LAB — TSH: TSH: 18.45 mIU/L — ABNORMAL HIGH (ref 0.40–4.50)

## 2020-03-24 DIAGNOSIS — H1045 Other chronic allergic conjunctivitis: Secondary | ICD-10-CM | POA: Diagnosis not present

## 2020-03-24 DIAGNOSIS — H43393 Other vitreous opacities, bilateral: Secondary | ICD-10-CM | POA: Diagnosis not present

## 2020-03-24 DIAGNOSIS — H26493 Other secondary cataract, bilateral: Secondary | ICD-10-CM | POA: Diagnosis not present

## 2020-03-24 DIAGNOSIS — H04123 Dry eye syndrome of bilateral lacrimal glands: Secondary | ICD-10-CM | POA: Diagnosis not present

## 2020-04-22 ENCOUNTER — Other Ambulatory Visit: Payer: PPO

## 2020-04-22 ENCOUNTER — Other Ambulatory Visit: Payer: Self-pay

## 2020-04-22 DIAGNOSIS — E7800 Pure hypercholesterolemia, unspecified: Secondary | ICD-10-CM | POA: Diagnosis not present

## 2020-04-23 LAB — HEPATIC FUNCTION PANEL
AG Ratio: 1.6 (calc) (ref 1.0–2.5)
ALT: 18 U/L (ref 6–29)
AST: 20 U/L (ref 10–35)
Albumin: 4 g/dL (ref 3.6–5.1)
Alkaline phosphatase (APISO): 67 U/L (ref 37–153)
Bilirubin, Direct: 0.1 mg/dL (ref 0.0–0.2)
Globulin: 2.5 g/dL (calc) (ref 1.9–3.7)
Indirect Bilirubin: 0.4 mg/dL (calc) (ref 0.2–1.2)
Total Bilirubin: 0.5 mg/dL (ref 0.2–1.2)
Total Protein: 6.5 g/dL (ref 6.1–8.1)

## 2020-04-23 LAB — LIPID PANEL
Cholesterol: 134 mg/dL (ref <200)
HDL: 50 mg/dL (ref >=50)
LDL Cholesterol (Calc): 65 mg/dL (calc)
Non-HDL Cholesterol (Calc): 84 mg/dL (calc) (ref <130)
Total CHOL/HDL Ratio: 2.7 (calc) (ref <5.0)
Triglycerides: 111 mg/dL (ref <150)

## 2020-04-27 ENCOUNTER — Ambulatory Visit: Payer: PPO | Admitting: Orthopedic Surgery

## 2020-04-27 ENCOUNTER — Other Ambulatory Visit: Payer: Self-pay

## 2020-04-27 ENCOUNTER — Encounter: Payer: Self-pay | Admitting: Orthopedic Surgery

## 2020-04-27 ENCOUNTER — Ambulatory Visit (INDEPENDENT_AMBULATORY_CARE_PROVIDER_SITE_OTHER): Payer: PPO | Admitting: Orthopedic Surgery

## 2020-04-27 VITALS — BP 110/78 | HR 79 | Temp 97.9°F | Resp 20 | Ht 61.0 in | Wt 149.2 lb

## 2020-04-27 DIAGNOSIS — L853 Xerosis cutis: Secondary | ICD-10-CM

## 2020-04-27 DIAGNOSIS — I10 Essential (primary) hypertension: Secondary | ICD-10-CM | POA: Diagnosis not present

## 2020-04-27 DIAGNOSIS — F329 Major depressive disorder, single episode, unspecified: Secondary | ICD-10-CM | POA: Diagnosis not present

## 2020-04-27 DIAGNOSIS — F419 Anxiety disorder, unspecified: Secondary | ICD-10-CM | POA: Diagnosis not present

## 2020-04-27 DIAGNOSIS — E039 Hypothyroidism, unspecified: Secondary | ICD-10-CM

## 2020-04-27 DIAGNOSIS — G8929 Other chronic pain: Secondary | ICD-10-CM

## 2020-04-27 DIAGNOSIS — F4321 Adjustment disorder with depressed mood: Secondary | ICD-10-CM

## 2020-04-27 DIAGNOSIS — Z0000 Encounter for general adult medical examination without abnormal findings: Secondary | ICD-10-CM

## 2020-04-27 DIAGNOSIS — M545 Low back pain, unspecified: Secondary | ICD-10-CM | POA: Diagnosis not present

## 2020-04-27 DIAGNOSIS — E7800 Pure hypercholesterolemia, unspecified: Secondary | ICD-10-CM

## 2020-04-27 NOTE — Patient Instructions (Signed)
Try Zoloft when you feel comfortable- will help with anxiety too  Try meditation to help with anxiety       Dry Skin Care  What causes dry skin?  Dry skin is common and results from inadequate moisture in the outer skin layers. Dry skin usually results from the excessive loss of moisture from the skin surface. This occurs due to two major factors: 1. Normally the skin's oil glands deposit a layer of oil on the skin's surface. This layer of oil prevents the loss of moisture from the skin. Exposure to soaps, cleaners, solvents, and disinfectants removes this oily film, allowing water to escape. 2. Water loss from the skin increases when the humidity is low. During winter months we spend a lot of time indoors where the air is heated. Heated air has very low humidity. This also contributes to dry skin.  A tendency for dry skin may accompany such disorders as eczema. Also, as people age, the number of functioning oil glands decreases, and the tendency toward dry skin can be a sensation of skin tightness when emerging from the shower.  How do I manage dry skin?  1. Humidify your environment. This can be accomplished by using a humidifier in your bedroom at night during winter months. 2. Bathing can actually put moisture back into your skin if done right. Take the following steps while bathing to sooth dry skin:  Avoid hot water, which only dries the skin and makes itching worse. Use warm water.  Avoid washcloths or extensive rubbing or scrubbing.  Use mild soaps like unscented Dove, Oil of Olay, Cetaphil, Basis, or CeraVe.  If you take baths rather than showers, rinse off soap residue with clean water before getting out of tub.  Once out of the shower/tub, pat dry gently with a soft towel. Leave your skin damp.  While still damp, apply any medicated ointment/cream you were prescribed to the affected areas. After you apply your medicated ointment/cream, then apply your moisturizer to your  whole body.This is the most important step in dry skin care. If this is omitted, your skin will continue to be dry.  The choice of moisturizer is also very important. In general, lotion will not provider enough moisture to severely dry skin because it is water based. You should use an ointment or cream. Moisturizers should also be unscented. Good choices include Vaseline (plain petrolatum), Aquaphor, Cetaphil, CeraVe, Vanicream, DML Forte, Aveeno moisture, or Eucerin Cream.  Bath oils can be helpful, but do not replace the application of moisturizer after the bath. In addition, they make the tub slippery causing an increased risk for falls. Therefore, we do not recommend their use.
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
## 2020-04-28 ENCOUNTER — Other Ambulatory Visit: Payer: Self-pay

## 2020-04-28 ENCOUNTER — Other Ambulatory Visit: Payer: Self-pay | Admitting: Orthopedic Surgery

## 2020-04-28 DIAGNOSIS — E039 Hypothyroidism, unspecified: Secondary | ICD-10-CM

## 2020-04-28 LAB — TSH: TSH: 0.22 mIU/L — ABNORMAL LOW (ref 0.40–4.50)

## 2020-04-28 MED ORDER — LEVOTHYROXINE SODIUM 88 MCG PO TABS: 88.0000 ug | ORAL_TABLET | Freq: Every day | ORAL | 3 refills | Status: DC

## 2020-05-18 ENCOUNTER — Encounter: Payer: Self-pay | Admitting: Orthopedic Surgery

## 2020-05-18 ENCOUNTER — Ambulatory Visit (INDEPENDENT_AMBULATORY_CARE_PROVIDER_SITE_OTHER): Payer: PPO | Admitting: Orthopedic Surgery

## 2020-05-18 ENCOUNTER — Other Ambulatory Visit: Payer: Self-pay

## 2020-05-18 VITALS — BP 100/70 | HR 72 | Temp 98.1°F | Resp 20 | Ht 61.0 in | Wt 149.2 lb

## 2020-05-18 DIAGNOSIS — S1086XA Insect bite of other specified part of neck, initial encounter: Secondary | ICD-10-CM

## 2020-05-18 DIAGNOSIS — W57XXXA Bitten or stung by nonvenomous insect and other nonvenomous arthropods, initial encounter: Secondary | ICD-10-CM

## 2020-05-18 NOTE — Patient Instructions (Signed)
May apply ice to area for itching May apply hydrocortisone 1% to area twice daily May start taking Claritin daily- it will help itch   Insect Bite, Adult An insect bite can make your skin red, itchy, and swollen. Some insects can spread disease to people with a bite. However, most insect bites do not lead to disease, and most are not serious. What are the causes? Insects may bite for many reasons, including:  Hunger.  To defend themselves. Insects that bite include:  Spiders.  Mosquitoes.  Ticks.  Fleas.  Ants.  Flies.  Kissing bugs.  Chiggers. What are the signs or symptoms? Symptoms of this condition include:  Itching or pain in the bite area.  Redness and swelling in the bite area.  An open wound (skin ulcer). Symptoms often last for 2-4 days. In rare cases, a person may have a very bad allergic reaction (anaphylactic reaction) to a bite. Symptoms of an anaphylactic reaction may include:  Feeling warm in the face (flushed). Your face may turn red.  Itchy, red, swollen areas of skin (hives).  Swelling of the: ? Eyes. ? Lips. ? Face. ? Mouth. ? Tongue. ? Throat.  Trouble with any of these: ? Breathing. ? Talking. ? Swallowing.  Loud breathing (wheezing).  Feeling dizzy or light-headed.  Passing out (fainting).  Pain or cramps in your belly.  Throwing up (vomiting).  Watery poop (diarrhea). How is this treated? Treatment is usually not needed. Symptoms often go away on their own. When treatment is needed, it may involve:  Putting a cream or lotion on the bite area. This helps with itching.  Taking an antibiotic medicine. This treatment is needed if the bite area gets infected.  Getting a tetanus shot, if you are not up to date on this vaccine.  Putting ice on the affected area.  Using medicines called antihistamines. This treatment may be needed if you have itching or an allergic reaction to the insect bite.  Giving yourself a shot of  medicine (epinephrine) using an auto-injector "pen" if you have an anaphylactic reaction to a bite. Your doctor will teach you how to use this pen. Follow these instructions at home: Bite area care  Do not scratch the bite area.  Keep the bite area clean and dry.  Wash the bite area every day with soap and water as told by your doctor.  Check the bite area every day for signs of infection. Check for: ? Redness, swelling, or pain. ? Fluid or blood. ? Warmth. ? Pus or a bad smell.   Managing pain, itching, and swelling  You may put any of these on the bite area as told by your doctor: ? A paste made of baking soda and water. ? Cortisone cream. ? Calamine lotion.  If told, put ice on the bite area. ? Put ice in a plastic bag. ? Place a towel between your skin and the bag. ? Leave the ice on for 20 minutes, 2-3 times a day.   General instructions  Apply or take over-the-counter and prescription medicines only as told by your doctor.  If you were prescribed an antibiotic medicine, take or apply it as told by your doctor. Do not stop using the antibiotic even if your condition improves.  Keep all follow-up visits as told by your doctor. This is important. How is this prevented? To help you have a lower risk of insect bites:  When you are outside, wear clothing that covers your arms and legs.  Use insect repellent. The best insect repellents contain one of these: ? DEET. ? Picaridin. ? Oil of lemon eucalyptus (OLE). ? IR3535.  Consider spraying your clothing with a pesticide called permethrin. Permethrin helps prevent insect bites. It works for several weeks and for up to 5-6 clothing washes. Do not apply permethrin directly to the skin.  If your home windows do not have screens, think about putting some in.  If you will be sleeping in an area where there are mosquitoes, consider covering your sleeping area with a mosquito net. Contact a doctor if:  You have redness,  swelling, or pain in the bite area.  You have fluid or blood coming from the bite area.  The bite area feels warm to the touch.  You have pus or a bad smell coming from the bite area.  You have a fever. Get help right away if:  You have joint pain.  You have a rash.  You feel more tired or sleepy than you normally do.  You have neck pain.  You have a headache.  You feel weaker than you normally do.  You have signs of an anaphylactic reaction. Signs may include: ? Feeling warm in the face. ? Itchy, red, swollen areas of skin. ? Swelling of your:  Eyes.  Lips.  Face.  Mouth.  Tongue.  Throat. ? Trouble with any of these:  Breathing.  Talking.  Swallowing. ? Loud breathing. ? Feeling dizzy or light-headed. ? Passing out. ? Pain or cramps in your belly. ? Throwing up. ? Watery poop. These symptoms may be an emergency. Do not wait to see if the symptoms will go away. Do this right away:  Use your auto-injector pen as you have been told.  Get medical help. Call your local emergency services (911 in the [place] Do not drive yourself to the hospital. Summary  An insect bite can make your skin red, itchy, and swollen.  Treatment is usually not needed. Symptoms often go away on their own.  Do not scratch the bite area. Keep it clean and dry.  Ice can help with pain and itching from the bite. This information is not intended to replace advice given to you by your health care provider. Make sure you discuss any questions you have with your health care provider. Document Revised: 01/20/2020 Document Reviewed: 09/07/2017 Elsevier Patient Education  2021 [employer].

## 2020-05-18 NOTE — Progress Notes (Signed)
Careteam: Patient Care Team: Yvonna Alanis, NP as PCP - General (Adult Health Nurse Practitioner) [name], MD as Consulting Physician (Ophthalmology)  Seen by: Windell Moulding, AGNP-C  PLACE OF SERVICE:  [place] Directive information Does Patient Have a Medical Advance Directive?: Yes (Will Bring copy), Type of Advance Directive: [place];Living will, Does patient want to make changes to medical advance directive?: No - Patient declined  Allergies  Allergen Reactions  . Penicillins Rash and Shortness Of Breath  . Tetanus Toxoid Adsorbed Rash    Chief Complaint  Patient presents with  . Acute Visit  . Advanced Directive    Left rear side of neck has a red swollen area that itches and is painful, Discuss Zoloft with patient     HPI: Patient is a 75 y.o. female seen today for acute visit for left neck itching and burning.  She noticed symptoms at 3 am this morning. Woke her up from her sleep. She has not tried any interventions to relieve symptoms. Seeking medical attention today because she cannot visualize area on her neck.   Reports she tried to take Zoloft twice but stopped medication due to nausea and GI upset. She continues to grieve her husband. Working on settling his estate at this time.   Review of Systems:  Review of Systems  Constitutional: Negative for chills, fever and malaise/fatigue.  Respiratory: Negative for cough, shortness of breath and wheezing.   Cardiovascular: Negative for chest pain and leg swelling.  Skin: Positive for itching.  Psychiatric/Behavioral: Positive for depression. The patient is not nervous/anxious.     Past Medical History:  Diagnosis Date  . High cholesterol   . History of bone density study   . History of mammogram 2021  . Hypertension   . Hypothyroidism    Past Surgical History:  Procedure Laterality Date  . OTHER SURGICAL HISTORY     Cataracts Surgery by Dr.Hecker   Social History:    reports that she has never smoked. She has never used smokeless tobacco. She reports that she does not drink alcohol and does not use drugs.  Family History  Problem Relation Age of Onset  . Cancer Mother   . Congestive Heart Failure Father     Medications: Patient's Medications  New Prescriptions   No medications on file  Previous Medications   ACETAMINOPHEN (TYLENOL) 500 MG TABLET    Take by mouth daily as needed. 1-2 tablets   ASPIRIN 81 MG CHEWABLE TABLET    Chew 81 mg by mouth daily.   CALCIUM CARBONATE-VIT D-MIN (CALCIUM 1200 PO)    Take 1 tablet by mouth daily.   LEVOTHYROXINE (SYNTHROID) 88 MCG TABLET    Take 1 tablet (88 mcg total) by mouth daily.   LISINOPRIL-HYDROCHLOROTHIAZIDE (ZESTORETIC) 10-12.5 MG TABLET    Take 1 tablet by mouth daily.   MULTIPLE VITAMINS-MINERALS (MULTIVITAMIN GUMMIES ADULT PO)    Take 1 capsule by mouth daily in the afternoon.   PROBIOTIC PRODUCT (PROBIOTIC PO)    Take 1 capsule by mouth every evening.   SERTRALINE (ZOLOFT) 50 MG TABLET    Take 1 tablet (50 mg total) by mouth daily.   SIMVASTATIN (ZOCOR) 20 MG TABLET    Take 1 tablet (20 mg total) by mouth daily at 6 PM.  Modified Medications   No medications on file  Discontinued Medications   No medications on file    Physical Exam:  Vitals:   05/18/20 0927  BP:  100/70  Pulse: 72  Resp: 20  Temp: 98.1 F (36.7 C)  TempSrc: Temporal  SpO2: 99%  Weight: 149 lb 3.2 oz (67.7 kg)  Height: 5\' 1"  (1.549 m)   Body mass index is 28.19 kg/m. Wt Readings from Last 3 Encounters:  05/18/20 149 lb 3.2 oz (67.7 kg)  04/27/20 149 lb 3.2 oz (67.7 kg)  03/16/20 151 lb 9.6 oz (68.8 kg)    Physical Exam Vitals reviewed.  Constitutional:      General: She is not in acute distress. HENT:     Head: Normocephalic.     Nose: Nose normal.     Mouth/Throat:     Mouth: Mucous membranes are moist.  Neck:   Musculoskeletal:     Cervical back: Full passive range of motion without pain.   Lymphadenopathy:     Cervical: No cervical adenopathy.  Neurological:     Mental Status: She is alert.     Labs reviewed: Basic Metabolic Panel: Recent Labs    03/16/20 1438 04/27/20 1116  NA 137  --   K 4.1  --   CL 102  --   CO2 28  --   GLUCOSE 96  --   BUN 24  --   CREATININE 0.81  --   CALCIUM 9.8  --   TSH 18.45* 0.22*   Liver Function Tests: Recent Labs    03/16/20 1438 04/22/20 0841  AST 18 20  ALT 17 18  BILITOT 0.3 0.5  PROT 6.8 6.5   No results for input(s): LIPASE, AMYLASE in the last 8760 hours. No results for input(s): AMMONIA in the last 8760 hours. CBC: Recent Labs    03/16/20 1438  WBC 7.1  NEUTROABS 4,388  HGB 16.1*  HCT 48.5*  MCV 95.1  PLT 205   Lipid Panel: Recent Labs    04/22/20 0841  CHOL 134  HDL 50  LDLCALC 65  TRIG 111  CHOLHDL 2.7   TSH: Recent Labs    03/16/20 1438 04/27/20 1116  TSH 18.45* 0.22*   A1C: No results found for: HGBA1C   Assessment/for Plan 1. Insect bite of other part of neck, initial encounter - 2 insect bites present on left side of neck, she reports mainly itching and minor burning - suspect burning from itching bite open - advised to keep area clean with soap and water to prevent infection - recommend applying ice for itching 5-10 minute intervals - may use hydrocortisone cream 1 % for itching prn - may also use Claritin to help with itching - contact PCP if symptoms worsen or so not subside  I provided 15 minutes of face-to-face time during this encounter.     Next appt: 10/26/2020 Windell Moulding, [facility] [phone]

## 2020-06-10 ENCOUNTER — Other Ambulatory Visit: Payer: Self-pay

## 2020-06-10 ENCOUNTER — Other Ambulatory Visit: Payer: PPO

## 2020-06-10 DIAGNOSIS — E039 Hypothyroidism, unspecified: Secondary | ICD-10-CM

## 2020-06-10 LAB — TSH: TSH: 0.16 mIU/L — ABNORMAL LOW (ref 0.40–4.50)

## 2020-06-11 ENCOUNTER — Other Ambulatory Visit: Payer: Self-pay | Admitting: Orthopedic Surgery

## 2020-06-11 DIAGNOSIS — E039 Hypothyroidism, unspecified: Secondary | ICD-10-CM

## 2020-06-11 MED ORDER — LEVOTHYROXINE SODIUM 75 MCG PO TABS
75.0000 ug | ORAL_TABLET | Freq: Every day | ORAL | 1 refills | Status: DC
Start: 2020-06-11 — End: 2020-09-14

## 2020-07-07 ENCOUNTER — Other Ambulatory Visit: Payer: Self-pay | Admitting: *Deleted

## 2020-07-07 DIAGNOSIS — E7800 Pure hypercholesterolemia, unspecified: Secondary | ICD-10-CM

## 2020-07-07 MED ORDER — SIMVASTATIN 20 MG PO TABS: 20.0000 mg | ORAL_TABLET | Freq: Every day | ORAL | 1 refills | Status: DC

## 2020-07-07 NOTE — Telephone Encounter (Signed)
Patient requested refill. Pharmacy did not get Rx when faxed in January.

## 2020-07-22 ENCOUNTER — Other Ambulatory Visit: Payer: PPO

## 2020-07-22 ENCOUNTER — Other Ambulatory Visit: Payer: Self-pay

## 2020-07-22 DIAGNOSIS — E039 Hypothyroidism, unspecified: Secondary | ICD-10-CM | POA: Diagnosis not present

## 2020-07-23 LAB — TSH: TSH: 1.82 mIU/L (ref 0.40–4.50)

## 2020-07-26 ENCOUNTER — Other Ambulatory Visit: Payer: Self-pay

## 2020-07-26 DIAGNOSIS — E039 Hypothyroidism, unspecified: Secondary | ICD-10-CM

## 2020-09-14 ENCOUNTER — Other Ambulatory Visit: Payer: Self-pay | Admitting: Orthopedic Surgery

## 2020-09-14 DIAGNOSIS — E039 Hypothyroidism, unspecified: Secondary | ICD-10-CM

## 2020-09-21 ENCOUNTER — Other Ambulatory Visit: Payer: Self-pay | Admitting: Orthopedic Surgery

## 2020-09-21 DIAGNOSIS — I10 Essential (primary) hypertension: Secondary | ICD-10-CM

## 2020-09-23 ENCOUNTER — Telehealth: Payer: PPO | Admitting: Orthopedic Surgery

## 2020-09-23 NOTE — Telephone Encounter (Signed)
Called to schedule AWV. Left message to call in to get scheduled.

## 2020-10-07 ENCOUNTER — Other Ambulatory Visit: Payer: Self-pay

## 2020-10-07 ENCOUNTER — Ambulatory Visit (INDEPENDENT_AMBULATORY_CARE_PROVIDER_SITE_OTHER): Payer: PPO | Admitting: Orthopedic Surgery

## 2020-10-07 ENCOUNTER — Telehealth: Payer: Self-pay

## 2020-10-07 ENCOUNTER — Encounter: Payer: Self-pay | Admitting: Orthopedic Surgery

## 2020-10-07 DIAGNOSIS — Z0000 Encounter for general adult medical examination without abnormal findings: Secondary | ICD-10-CM

## 2020-10-07 NOTE — Progress Notes (Signed)
Subjective:   Terri Mcgee is a 75 y.o. female who presents for Medicare Annual (Subsequent) preventive examination.  Review of Systems     Cardiac Risk Factors include: advanced age (>38mn, >>67women);hypertension;dyslipidemia     Objective:    There were no vitals filed for this visit. There is no height or weight on file to calculate BMI.  Advanced Directives 10/07/2020 05/18/2020 04/27/2020  Does Patient Have a Medical Advance Directive? Yes Yes Yes  Type of AParamedicof AMount [place]Living will [place] will [place] will  Does patient want to make changes to medical advance directive? No - Patient declined No - Patient declined No - Patient declined  Copy of HWingerin Chart? Yes - validated most recent copy scanned in chart (See row information) Yes - validated most recent copy scanned in chart (See row information) Yes - validated most recent copy scanned in chart (See row information)    Current Medications (verified) Outpatient Encounter Medications as of 10/07/2020  Medication Sig   acetaminophen (TYLENOL) 500 MG tablet Take by mouth daily as needed. 1-2 tablets   aspirin 81 MG chewable tablet Chew 81 mg by mouth daily.   Calcium Carbonate-Vit D-Min (CALCIUM 1200 PO) Take 1 tablet by mouth daily.   EUTHYROX 75 MCG tablet Take 1 tablet by mouth once daily   lisinopril-hydrochlorothiazide (ZESTORETIC) 10-12.5 MG tablet Take 1 tablet by mouth once daily   Multiple Vitamins-Minerals (MULTIVITAMIN GUMMIES ADULT PO) Take 1 capsule by mouth daily in the afternoon.   simvastatin (ZOCOR) 20 MG tablet Take 1 tablet (20 mg total) by mouth daily at 6 PM.   [DISCONTINUED] Probiotic Product (PROBIOTIC PO) Take 1 capsule by mouth every evening. (Patient not taking: Reported on 10/07/2020)   [DISCONTINUED] sertraline (ZOLOFT) 50 MG tablet Take 1 tablet (50 mg total) by mouth daily. (Patient  not taking: No sig reported)   No facility-administered encounter medications on file as of 10/07/2020.    Allergies (verified) Penicillins and Tetanus toxoid adsorbed   History: Past Medical History:  Diagnosis Date   High cholesterol    History of bone density study    History of mammogram 2021   Hypertension    Hypothyroidism    Past Surgical History:  Procedure Laterality Date   OTHER SURGICAL HISTORY     Cataracts Surgery by Dr.Hecker   Family History  Problem Relation Age of Onset   Cancer Mother    Congestive Heart Failure Father    Social History   Socioeconomic History   Marital status: Married    Spouse name: Not on file   Number of children: Not on file   Years of education: Not on file   Highest education level: Not on file  Occupational History   Not on file  Tobacco Use   Smoking status: Never   Smokeless tobacco: Never  Vaping Use   Vaping Use: Never used  Substance and Sexual Activity   Alcohol use: Never   Drug use: Never   Sexual activity: Not Currently  Other Topics Concern   Not on file  Social History Narrative   Tobacco use, amount per day now: I have never smoked.   Past tobacco use, amount per day:   How many years did you use tobacco:   Alcohol use (drinks per week): I do not drink.   Diet:   Do you drink/eat things with caffeine: Yes.   Marital status: Widow  What year were you married? 1980   Do you live in a house, apartment, assisted living, condo, trailer, etc.? House.   Is it one or more stories? Yes but Im moving to a 1 story.   How many persons live in your home? 1   Do you have pets in your home?( please list) Dog   Highest Level of education completed? Graduate School.   Current or past profession: Web designer.   Do you exercise? Yes.                                 Type and how often? Zumba 2 times a week/ Silver sneakers 2 times a week.    Do you have a living will? Yes   Do  you have a DNR form? Yes                                  If not, do you want to discuss one?   Do you have signed POA/HPOA forms? Yes                        If so, please bring to you appointment      Do you have any difficulty bathing or dressing yourself? No.   Do you have any difficulty preparing food or eating? No.   Do you have any difficulty managing your medications? No.   Do you have any difficulty managing your finances? No.   Do you have any difficulty affording your medications? No.   Social Determinants of Health   Financial Resource Strain: Low Risk    Difficulty of Paying Living Expenses: Not hard at all  Food Insecurity: No Food Insecurity   Worried About Charity fundraiser in the Last Year: Never true   Ran Out of Food in the Last Year: Never true  Transportation Needs: No Transportation Needs   Lack of Transportation (Medical): No   Lack of Transportation (Non-Medical): No  Physical Activity: Sufficiently Active   Days of Exercise per Week: 3 days   Minutes of Exercise per Session: 60 min  Stress: No Stress Concern Present   Feeling of Stress : Only a little  Social Connections: Moderately Isolated   Frequency of Communication with Friends and Family: Three times a week   Frequency of Social Gatherings with Friends and Family: Three times a week   Attends Religious Services: Never   Active Member of Clubs or Organizations: Yes   Attends Archivist Meetings: More than 4 times per year   Marital Status: Widowed    Tobacco Counseling Counseling given: Not Answered   Clinical Intake:  Pre-visit preparation completed: Yes  Pain : No/denies pain     BMI - recorded: 26.1 Nutritional Status: BMI 25 -29 Overweight Nutritional Risks: None Diabetes: No  How often do you need to have someone help you when you read instructions, pamphlets, or other written materials from your doctor or pharmacy?: 1 - Never What is the last grade level you completed  in school?: Graduate school  Diabetic?No  Interpreter Needed?: No      Activities of Daily Living In your present state of health, do you have any difficulty performing the following activities: 10/07/2020  Hearing? N  Vision? N  Difficulty concentrating or making decisions? N  Walking or climbing  stairs? N  Dressing or bathing? N  Doing errands, shopping? N  Preparing Food and eating ? N  Using the Toilet? N  In the past six months, have you accidently leaked urine? N  Do you have problems with loss of bowel control? N  Managing your Medications? N  Managing your Finances? N  Housekeeping or managing your Housekeeping? N  Some recent data might be hidden    Patient Care Team: Yvonna Alanis, NP as PCP - General (Adult Health Nurse Practitioner) [name], MD as Consulting Physician (Ophthalmology)  Indicate any recent Medical Services you may have received from other than [facility] providers in the past year (date may be approximate).     Assessment:   This is a routine wellness examination for Tereka.  Hearing/Vision screen Hearing Screening - Comments:: No hearing issues  Vision Screening - Comments:: Last eye exam was January 2022   Dietary issues and exercise activities discussed: Current Exercise Habits: Structured exercise class, Type of exercise: strength training/weights;stretching;walking, Time (Minutes): 60, Frequency (Times/Week): 3, Weekly Exercise (Minutes/Week): 180, Intensity: Moderate, Exercise limited by: orthopedic condition(s) (back pain)   Goals Addressed             This Visit's Progress    DIET - REDUCE CALORIE INTAKE   On track      Depression Screen PHQ 2/9 Scores 10/07/2020 10/07/2020 03/16/2020  PHQ - 2 Score 3 0 3  PHQ- 9 Score 6 - 11    Fall Risk Fall Risk  10/07/2020 10/07/2020 05/18/2020 04/27/2020 03/16/2020  Falls in the past year? 0 0 0 0 1  Number falls in past yr: 0 0 0 0 0  Injury with Fall? 0 0 0 0 0  Risk for fall due to : No  Fall Risks No Fall Risks - - -  Follow up Falls evaluation completed Falls evaluation completed - - -    FALL RISK PREVENTION PERTAINING TO THE HOME:  Any stairs in or around the home? No  If so, are there any without handrails? No  Home free of loose throw rugs in walkways, pet beds, electrical cords, etc? No  Adequate lighting in your home to reduce risk of falls? No   ASSISTIVE DEVICES UTILIZED TO PREVENT FALLS:  Life alert?  Smart watch Use of a cane, walker or w/c? No  Grab bars in the bathroom? No  Shower chair or bench in shower? No  Elevated toilet seat or a handicapped toilet? Yes   TIMED UP AND GO:  Was the test performed? No .  Length of time to ambulate 10 feet:  sec.   Gait steady and fast with assistive device  Cognitive Function: MMSE - Mini Mental State Exam 04/27/2020  Orientation to time 5  Orientation to Place 5  Registration 3  Attention/ Calculation 5  Recall 3  Language- name 2 objects 2  Language- repeat 1  Language- follow 3 step command 3  Language- read & follow direction 1  Write a sentence 1  Copy design 1  Total score 30     6CIT Screen 10/07/2020  What Year? 0 points  What month? 0 points  What time? 0 points  Count back from 20 0 points  Months in reverse 0 points  Repeat phrase 0 points  Total Score 0    Immunizations Immunization History  Administered Date(s) Administered   Influenza,inj,quad, With Preservative 11/28/2012   Influenza-Unspecified 11/20/2011   PFIZER(Purple Top)SARS-COV-2 Vaccination 05/04/2019, 05/28/2019, 11/28/2019   Pneumococcal-Unspecified  11/20/2011   Zoster Recombinat (Shingrix) 11/02/2017, 01/11/2018    TDAP status: Due, Education has been provided regarding the importance of this vaccine. Advised may receive this vaccine at local pharmacy or Health Dept. Aware to provide a copy of the vaccination record if obtained from local pharmacy or Health Dept. Verbalized acceptance and understanding.  Flu  Vaccine status: Due, Education has been provided regarding the importance of this vaccine. Advised may receive this vaccine at local pharmacy or Health Dept. Aware to provide a copy of the vaccination record if obtained from local pharmacy or Health Dept. Verbalized acceptance and understanding.  Pneumococcal vaccine status: Due, Education has been provided regarding the importance of this vaccine. Advised may receive this vaccine at local pharmacy or Health Dept. Aware to provide a copy of the vaccination record if obtained from local pharmacy or Health Dept. Verbalized acceptance and understanding.  Covid-19 vaccine status: Completed vaccines  Qualifies for Shingles Vaccine? Yes   Zostavax completed No   Shingrix Completed?: Yes  Screening Tests Health Maintenance  Topic Date Due   Hepatitis C Screening  Never done   TETANUS/TDAP  Never done   COLONOSCOPY (Pts 45-7yr Insurance coverage will need to be confirmed)  Never done   MAMMOGRAM  Never done   DEXA SCAN  Never done   PNA vac Low Risk Adult (2 of 2 - PCV13) 11/19/2012   COVID-19 Vaccine (4 - Booster for Pfizer series) 03/29/2020   INFLUENZA VACCINE  10/11/2020   Zoster Vaccines- Shingrix  Completed   HPV VACCINES  Aged Out    Health Maintenance  Health Maintenance Due  Topic Date Due   Hepatitis C Screening  Never done   TETANUS/TDAP  Never done   COLONOSCOPY (Pts 45-412yrInsurance coverage will need to be confirmed)  Never done   MAMMOGRAM  Never done   DEXA SCAN  Never done   PNA vac Low Risk Adult (2 of 2 - PCV13) 11/19/2012   COVID-19 Vaccine (4 - Booster for PfBickletoneries) 03/29/2020    Colorectal cancer screening: Type of screening: FOBT/FIT. Completed refused/ refused colonoscopy. Repeat every 0 years  Mammogram status: Completed plans to schedule in October 2022. Repeat every year  Bone Density status: Completed plans to schedule in October 2022. Results reflect: Bone density results: NORMAL. Repeat every  unknown- test recommended years.  Lung Cancer Screening: (Low Dose CT Chest recommended if Age 74-80ears, 30 pack-year currently smoking OR have quit w/in 15years.) does not qualify.   Lung Cancer Screening Referral: No  Additional Screening:  Hepatitis C Screening: does not qualify; Completed No  Vision Screening: Recommended annual ophthalmology exams for early detection of glaucoma and other disorders of the eye. Is the patient up to date with their annual eye exam?  Yes  Who is the provider or what is the name of the office in which the patient attends annual eye exams? Dr. HeHerbert Deanerf pt is not established with a provider, would they like to be referred to a provider to establish care? No .   Dental Screening: Recommended annual dental exams for proper oral hygiene  Community Resource Referral / Chronic Care Management: CRR required this visit?  No   CCM required this visit?  No      Plan:     I have personally reviewed and noted the following in the patient's chart:   Medical and social history Use of alcohol, tobacco or illicit drugs  Current medications and supplements including opioid prescriptions.  Functional ability and status Nutritional status Physical activity Advanced directives List of other physicians Hospitalizations, surgeries, and ER visits in previous 12 months Vitals Screenings to include cognitive, depression, and falls Referrals and appointments  In addition, I have reviewed and discussed with patient certain preventive protocols, quality metrics, and best practice recommendations. A written personalized care plan for preventive services as well as general preventive health recommendations were provided to patient.   Total time: 31 minutes  Yvonna Alanis, NP   10/07/2020

## 2020-10-07 NOTE — Patient Instructions (Signed)
  Ms. Terri Mcgee , Thank you for taking time to come for your Medicare Wellness Visit. I appreciate your ongoing commitment to your health goals. Please review the following plan we discussed and let me know if I can assist you in the future.   These are the goals we discussed:  Goals      DIET - REDUCE CALORIE INTAKE        This is a list of the screening recommended for you and due dates:  Health Maintenance  Topic Date Due   Hepatitis C Screening: USPSTF Recommendation to screen - Ages 100-79 yo.  Never done   Tetanus Vaccine  Never done   Colon Cancer Screening  Never done   Mammogram  Never done   DEXA scan (bone density measurement)  Never done   Pneumonia vaccines (2 of 2 - PCV13) 11/19/2012   COVID-19 Vaccine (4 - Booster for Pfizer series) 03/29/2020   Flu Shot  10/11/2020   Zoster (Shingles) Vaccine  Completed   HPV Vaccine  Aged Out

## 2020-10-07 NOTE — Addendum Note (Signed)
Addended by: Logan Bores on: 10/07/2020 10:35 AM   Modules accepted: Orders

## 2020-10-07 NOTE — Telephone Encounter (Signed)
Ms. lysette, kinyon are scheduled for a virtual visit with your provider today.    Just as we do with appointments in the office, we must obtain your consent to participate.  Your consent will be active for this visit and any virtual visit you may have with one of our providers in the next 365 days.    If you have a MyChart account, I can also send a copy of this consent to you electronically.  All virtual visits are billed to your insurance company just like a traditional visit in the office.  As this is a virtual visit, video technology does not allow for your provider to perform a traditional examination.  This may limit your provider's ability to fully assess your condition.  If your provider identifies any concerns that need to be evaluated in person or the need to arrange testing such as labs, EKG, etc, we will make arrangements to do so.    Although advances in technology are sophisticated, we cannot ensure that it will always work on either your end or our end.  If the connection with a video visit is poor, we may have to switch to a telephone visit.  With either a video or telephone visit, we are not always able to ensure that we have a secure connection.   I need to obtain your verbal consent now.   Are you willing to proceed with your visit today?   HARUYE LAFRENIER has provided verbal consent on 10/07/2020 for a virtual visit (video or telephone).   Leigh Aurora [place], [place] 10/07/2020  9:46 AM

## 2020-10-07 NOTE — Progress Notes (Signed)
   This service is provided via telemedicine  No vital signs collected/recorded due to the encounter was a telemedicine visit.   Location of patient (ex: home, work):  Home  Patient consents to a telephone visit: Yes, see telephone visit dated   Location of the provider (ex: office, home):  [facility], Office   Name of any referring provider:  N/A  Names of all persons participating in the telemedicine service and their role in the encounter:  S.Chrae B/CMA, Windell Moulding, NP, and Patient   Time spent on call:  9 min with medical assistant

## 2020-10-07 NOTE — Telephone Encounter (Signed)
Called patient and left a detailed message informing her to call her insurance company prior to next appointment to see which pneumonia vaccine she had before.   I informed patient in voicemail that the insurance company will pay for the prevnar 13 and pneu 23 x once after the age of 76, if patient receives either vaccine more than once, it may results in an out of pocket fee. Once patient provides [place] with the information from [employer] we will know how to proceed going forward with closing the immunization vaccine care gaps.    Patient was instructed to call back if she has questions or concerns regarding the details of this message

## 2020-10-21 ENCOUNTER — Other Ambulatory Visit: Payer: Self-pay

## 2020-10-21 ENCOUNTER — Other Ambulatory Visit: Payer: PPO

## 2020-10-21 DIAGNOSIS — Z0000 Encounter for general adult medical examination without abnormal findings: Secondary | ICD-10-CM

## 2020-10-21 DIAGNOSIS — E039 Hypothyroidism, unspecified: Secondary | ICD-10-CM | POA: Diagnosis not present

## 2020-10-22 LAB — TSH: TSH: 1.3 mIU/L (ref 0.40–4.50)

## 2020-10-22 LAB — HEPATITIS C ANTIBODY
Hepatitis C Ab: NONREACTIVE
SIGNAL TO CUT-OFF: 0.01 (ref <1.00)

## 2020-10-26 ENCOUNTER — Ambulatory Visit: Payer: PPO | Admitting: Orthopedic Surgery

## 2020-10-28 ENCOUNTER — Other Ambulatory Visit: Payer: Self-pay

## 2020-10-28 ENCOUNTER — Encounter: Payer: Self-pay | Admitting: Orthopedic Surgery

## 2020-10-28 ENCOUNTER — Ambulatory Visit (INDEPENDENT_AMBULATORY_CARE_PROVIDER_SITE_OTHER): Payer: PPO | Admitting: Orthopedic Surgery

## 2020-10-28 VITALS — BP 116/70 | HR 69 | Temp 97.1°F | Ht 61.0 in | Wt 138.4 lb

## 2020-10-28 DIAGNOSIS — B351 Tinea unguium: Secondary | ICD-10-CM

## 2020-10-28 DIAGNOSIS — I10 Essential (primary) hypertension: Secondary | ICD-10-CM | POA: Diagnosis not present

## 2020-10-28 DIAGNOSIS — G8929 Other chronic pain: Secondary | ICD-10-CM | POA: Diagnosis not present

## 2020-10-28 DIAGNOSIS — E7800 Pure hypercholesterolemia, unspecified: Secondary | ICD-10-CM | POA: Diagnosis not present

## 2020-10-28 DIAGNOSIS — M25552 Pain in left hip: Secondary | ICD-10-CM | POA: Diagnosis not present

## 2020-10-28 DIAGNOSIS — F329 Major depressive disorder, single episode, unspecified: Secondary | ICD-10-CM | POA: Diagnosis not present

## 2020-10-28 DIAGNOSIS — M25551 Pain in right hip: Secondary | ICD-10-CM

## 2020-10-28 DIAGNOSIS — E039 Hypothyroidism, unspecified: Secondary | ICD-10-CM

## 2020-10-28 DIAGNOSIS — M545 Low back pain, unspecified: Secondary | ICD-10-CM | POA: Diagnosis not present

## 2020-10-28 NOTE — Patient Instructions (Signed)
Think about colonoscopy or cologuard  Please get flu vaccine prior to October 31st 2022  Please check about Prevnar 13 vaccine with insurance- may check with [employer]

## 2020-10-28 NOTE — Progress Notes (Signed)
Careteam: Patient Care Team: Terri Alanis, NP as PCP - General (Adult Health Nurse Practitioner) [name], MD as Consulting Physician (Ophthalmology)  Seen by: Windell Moulding, AGNP-C  PLACE OF SERVICE:  [place] Directive information    Allergies  Allergen Reactions   Penicillins Rash and Shortness Of Breath   Tetanus Toxoid Adsorbed Rash    Chief Complaint  Patient presents with   Medical Management of Chronic Issues    Patient presents today for 6 month follow-up   Quality Metric Gaps    Per patients care gap she may be due for colonoscopy, DEXA scan (12/16/2020), mammogram (12/16/2020), PCV-13 and flu vaccine.     HPI: Patient is a 75 y.o. female seen today for medical management of chronic conditions.   Labs reviewed with patient.   Hypothyroidism- TSH 1.30. Continues to take levothyroxine 75 mcg daily. She reports feeling much better since starting thyroid medication, less fatigued.   Bilateral hip pain- occurring few times a day, associated with prolonged standing. Pain rated 3-4, described as stiff or sharp. Denies pain to groin. Will take tylenol prn for pain  Back pain- pain to lower back. Pain rated 3-4, described as stiff or sore. Aggravated by prolonged standing. Will take tylenol prn for pain.   Toe nail fungus- described as yellow and thickened toenails. Saw Dr. Milinda Pointer in 2018 for bunion. Reports researching many OTC remedies. She is not interested in seeing podiatry at this time. She is considering tea tree oil treatment.   Depression- still griving her husband. She is beginning to read and enjoy things again. She is still dealing with her husbands estate. She is also finding it hard to do some chores around her house, especially tasks her husband use to do. She would like a handyman to help her around the house. She may call senior resources. Unsuccessful trial of SSRI a few months ago.   Flu vaccine- she plans to get at [facility] this fall.    Unsure if she has had Prevnar 13 vaccination. She is willing to call insurance to find out coverage.   Colonoscopy- unsure when last study was done, if ever. Denies changes in bowels or blood in stool. Discussed FOBT, not inetersted at this time. No family history of colon cancer. Discussed Cologuard, she will think about it.   Mammogram and bone density- scheduled October 2022.     Review of Systems:  Review of Systems  Respiratory:  Negative for cough, shortness of breath and wheezing.   Cardiovascular:  Negative for chest pain and leg swelling.  Gastrointestinal:  Negative for abdominal pain, blood in stool, constipation, diarrhea, heartburn, nausea and vomiting.  Genitourinary:  Negative for dysuria, frequency and hematuria.  Musculoskeletal:  Positive for back pain and joint pain. Negative for falls.       Bilateral hip pain, lower back pain  Psychiatric/Behavioral:  Positive for depression. Negative for memory loss and suicidal ideas. The patient is nervous/anxious. The patient does not have insomnia.    Past Medical History:  Diagnosis Date   High cholesterol    History of bone density study    History of mammogram 2021   Hypertension    Hypothyroidism    Past Surgical History:  Procedure Laterality Date   OTHER SURGICAL HISTORY     Cataracts Surgery by Dr.Hecker   Social History:   reports that she has never smoked. She has never used smokeless tobacco. She reports that she does not drink alcohol  and does not use drugs.  Family History  Problem Relation Age of Onset   Cancer Mother    Congestive Heart Failure Father     Medications: Patient's Medications  New Prescriptions   No medications on file  Previous Medications   ACETAMINOPHEN (TYLENOL) 500 MG TABLET    Take by mouth daily as needed. 1-2 tablets   ASPIRIN 81 MG CHEWABLE TABLET    Chew 81 mg by mouth daily.   CALCIUM CARBONATE-VIT D-MIN (CALCIUM 1200 PO)    Take 1 tablet by mouth daily.   EUTHYROX 75  MCG TABLET    Take 1 tablet by mouth once daily   LISINOPRIL-HYDROCHLOROTHIAZIDE (ZESTORETIC) 10-12.5 MG TABLET    Take 1 tablet by mouth once daily   MULTIPLE VITAMINS-MINERALS (MULTIVITAMIN GUMMIES ADULT PO)    Take 1 capsule by mouth daily in the afternoon.   SIMVASTATIN (ZOCOR) 20 MG TABLET    Take 1 tablet (20 mg total) by mouth daily at 6 PM.  Modified Medications   No medications on file  Discontinued Medications   No medications on file    Physical Exam:  Vitals:   10/28/20 0957  BP: 116/70  Pulse: 69  Temp: (!) 97.1 F (36.2 C)  SpO2: 98%  Weight: 138 lb 6.4 oz (62.8 kg)  Height: '5\' 1"'$  (1.549 m)   Body mass index is 26.15 kg/m. Wt Readings from Last 3 Encounters:  10/28/20 138 lb 6.4 oz (62.8 kg)  05/18/20 149 lb 3.2 oz (67.7 kg)  04/27/20 149 lb 3.2 oz (67.7 kg)    Physical Exam Vitals reviewed.  Constitutional:      General: She is not in acute distress. HENT:     Head: Normocephalic.  Eyes:     General:        Right eye: No discharge.        Left eye: No discharge.  Neck:     Thyroid: No thyroid mass or thyromegaly.     Vascular: No carotid bruit.  Cardiovascular:     Rate and Rhythm: Normal rate and regular rhythm.     Pulses: Normal pulses.     Heart sounds: Normal heart sounds. No murmur heard. Pulmonary:     Effort: Pulmonary effort is normal. No respiratory distress.     Breath sounds: Normal breath sounds. No wheezing.  Abdominal:     General: Bowel sounds are normal. There is no distension.     Palpations: Abdomen is soft.     Tenderness: There is no abdominal tenderness.  Musculoskeletal:     Cervical back: Normal range of motion.     Right lower leg: No edema.     Left lower leg: No edema.  Lymphadenopathy:     Cervical: No cervical adenopathy.  Skin:    General: Skin is warm and dry.     Capillary Refill: Capillary refill takes less than 2 seconds.  Neurological:     General: No focal deficit present.     Mental Status: She is  alert and oriented to person, place, and time.  Psychiatric:        Mood and Affect: Mood normal.        Behavior: Behavior normal.    Labs reviewed: Basic Metabolic Panel: Recent Labs    03/16/20 1438 04/27/20 1116 06/10/20 1020 07/22/20 0947 10/21/20 0829  NA 137  --   --   --   --   K 4.1  --   --   --   --  CL 102  --   --   --   --   CO2 28  --   --   --   --   GLUCOSE 96  --   --   --   --   BUN 24  --   --   --   --   CREATININE 0.81  --   --   --   --   CALCIUM 9.8  --   --   --   --   TSH 18.45*   < > 0.16* 1.82 1.30   < > = values in this interval not displayed.   Liver Function Tests: Recent Labs    03/16/20 1438 04/22/20 0841  AST 18 20  ALT 17 18  BILITOT 0.3 0.5  PROT 6.8 6.5   No results for input(s): LIPASE, AMYLASE in the last 8760 hours. No results for input(s): AMMONIA in the last 8760 hours. CBC: Recent Labs    03/16/20 1438  WBC 7.1  NEUTROABS 4,388  HGB 16.1*  HCT 48.5*  MCV 95.1  PLT 205   Lipid Panel: Recent Labs    04/22/20 0841  CHOL 134  HDL 50  LDLCALC 65  TRIG 111  CHOLHDL 2.7   TSH: Recent Labs    06/10/20 1020 07/22/20 0947 10/21/20 0829  TSH 0.16* 1.82 1.30   A1C: No results found for: HGBA1C   Assessment/Plan 1. Acquired hypothyroidism - TSH 1.30 10/21/2020 - feeling less fatigued - cont levothyroxine - TSH; Future  2. Essential hypertension - controlled  - BUN/creat 24/0.81 03/16/2020 - cont lisinopril-HCTZ - CBC with Differential/Platelet; Future - CMP; Future  3. Pure hypercholesterolemia - LDL 65 04/22/2020 - cont statin - lipid panel- future  4. Reactive depression - related to husbands passing - starting to enjoy reading and other activities again - unsuccessful trial of SSRI in past  5. Chronic midline low back pain without sciatica - ongoing, triggered by standing - cont tylenol prn - may consider x-ray if symptoms progress  6. Bilateral hip pain - started with right hip pain  a few months ago, now both - pain triggered by standing - cont tylenol prn - may consider xray in future if symptoms progress  7. Toenail fungus - toenails yellow with some thickness - she is not interested in seeing podiatry at this time - discussed OTC remedies- considering trying tea tree oil  Total time: 38 minutes. Greater than 50% of total time spent doing patient education on health maintenance and medication management.    Next appt: Visit date not found   [place], [facility] [phone]

## 2020-12-11 DIAGNOSIS — M79676 Pain in unspecified toe(s): Secondary | ICD-10-CM

## 2020-12-11 HISTORY — DX: Pain in unspecified toe(s): M79.676

## 2020-12-23 ENCOUNTER — Other Ambulatory Visit: Payer: Self-pay

## 2020-12-23 ENCOUNTER — Other Ambulatory Visit: Payer: Self-pay | Admitting: Orthopedic Surgery

## 2020-12-23 DIAGNOSIS — E039 Hypothyroidism, unspecified: Secondary | ICD-10-CM

## 2020-12-23 DIAGNOSIS — I10 Essential (primary) hypertension: Secondary | ICD-10-CM

## 2020-12-23 MED ORDER — LEVOTHYROXINE SODIUM 75 MCG PO TABS: 75.0000 ug | ORAL_TABLET | Freq: Every day | ORAL | 1 refills | Status: DC

## 2020-12-28 DIAGNOSIS — Z1231 Encounter for screening mammogram for malignant neoplasm of breast: Secondary | ICD-10-CM | POA: Diagnosis not present

## 2020-12-28 DIAGNOSIS — M8589 Other specified disorders of bone density and structure, multiple sites: Secondary | ICD-10-CM | POA: Diagnosis not present

## 2020-12-28 LAB — HM DEXA SCAN

## 2020-12-28 LAB — HM MAMMOGRAPHY

## 2020-12-31 ENCOUNTER — Telehealth: Payer: Self-pay | Admitting: *Deleted

## 2020-12-31 ENCOUNTER — Encounter: Payer: Self-pay | Admitting: *Deleted

## 2020-12-31 NOTE — Telephone Encounter (Signed)
-----   Message from Yvonna Alanis, NP sent at 12/30/2020 10:37 PM EDT ----- Mammogram negative for malignancy. Repeat screening mammogram in 1 year.

## 2020-12-31 NOTE — Telephone Encounter (Signed)
Mammogram Abstracted to patient's chart.   Called and [facility] for patient to return call regarding results. Awaiting callback.

## 2021-01-03 NOTE — Telephone Encounter (Signed)
Patient notified and agreed.  

## 2021-01-03 NOTE — Telephone Encounter (Signed)
LMOM to return call.

## 2021-01-11 ENCOUNTER — Ambulatory Visit: Payer: PPO | Admitting: Podiatry

## 2021-01-20 ENCOUNTER — Telehealth: Payer: Self-pay | Admitting: *Deleted

## 2021-01-20 NOTE — Telephone Encounter (Signed)
I apologize for late response. Bone density confirms osteopenia, precursor to osteoporosis. Continue calcium and vitamin D supplement daily. Recommend weight bearing exercise like light walking and using weights. Repeat study in 2 years. Mammogram normal, repeat study in 1 year.

## 2021-01-20 NOTE — Telephone Encounter (Signed)
Patient called requesting her Bone Density Results. Stated that she received her Mammogram results but never the Bone Density and she had on the same day. (Report is under Media dated 12/28/2020)  Please Advise.

## 2021-01-20 NOTE — Telephone Encounter (Signed)
Patient notified and agreed.  

## 2021-02-10 ENCOUNTER — Encounter: Payer: Self-pay | Admitting: Podiatry

## 2021-02-10 ENCOUNTER — Other Ambulatory Visit: Payer: Self-pay

## 2021-02-10 ENCOUNTER — Ambulatory Visit: Payer: PPO | Admitting: Podiatry

## 2021-02-10 ENCOUNTER — Ambulatory Visit (INDEPENDENT_AMBULATORY_CARE_PROVIDER_SITE_OTHER): Payer: PPO

## 2021-02-10 DIAGNOSIS — M2012 Hallux valgus (acquired), left foot: Secondary | ICD-10-CM

## 2021-02-10 DIAGNOSIS — B351 Tinea unguium: Secondary | ICD-10-CM | POA: Diagnosis not present

## 2021-02-10 DIAGNOSIS — L601 Onycholysis: Secondary | ICD-10-CM | POA: Diagnosis not present

## 2021-02-10 DIAGNOSIS — B353 Tinea pedis: Secondary | ICD-10-CM

## 2021-02-10 DIAGNOSIS — M2042 Other hammer toe(s) (acquired), left foot: Secondary | ICD-10-CM

## 2021-02-10 DIAGNOSIS — L603 Nail dystrophy: Secondary | ICD-10-CM | POA: Diagnosis not present

## 2021-02-10 MED ORDER — TERBINAFINE HCL 250 MG PO TABS: 250.0000 mg | ORAL_TABLET | Freq: Every day | ORAL | 0 refills | Status: DC

## 2021-02-10 NOTE — Progress Notes (Signed)
Subjective:  Patient ID: Terri Mcgee, female    DOB: [birth year],  MRN: [id number] HPI Chief Complaint  Patient presents with   Toe Pain    2nd toe left - hammertoe deformity x years, hit toe x 1 month ago and scraped the knuckle, got red, swollen and very painful, better with covering with ointment, but still very sore   Skin Problem    Lateral foot/ankle left - rash x 1 month, started same time toe flare did, very itchy, tried Benadryl cream - temp relief   Nail Problem    Toenails bilateral - thick and discolored nails   New Patient (Initial Visit)    75 y.o. female presents with the above complaint.   ROS: Denies fever chills nausea vomiting muscle aches pains calf pain back pain chest pain shortness of breath.  Past Medical History:  Diagnosis Date   High cholesterol    History of bone density study    History of mammogram 2021   Hypertension    Hypothyroidism    Past Surgical History:  Procedure Laterality Date   OTHER SURGICAL HISTORY     Cataracts Surgery by Dr.Hecker    Current Outpatient Medications:    terbinafine (LAMISIL) 250 MG tablet, Take 1 tablet (250 mg total) by mouth daily., Disp: 30 tablet, Rfl: 0   acetaminophen (TYLENOL) 500 MG tablet, Take by mouth daily as needed. 1-2 tablets, Disp: , Rfl:    aspirin 81 MG chewable tablet, Chew 81 mg by mouth daily., Disp: , Rfl:    Calcium Carbonate-Vit D-Min (CALCIUM 1200 PO), Take 1 tablet by mouth daily., Disp: , Rfl:    FLUZONE HIGH-DOSE QUADRIVALENT 0.7 ML SUSY, , Disp: , Rfl:    levothyroxine (EUTHYROX) 75 MCG tablet, Take 1 tablet (75 mcg total) by mouth daily., Disp: 90 tablet, Rfl: 1   lisinopril-hydrochlorothiazide (ZESTORETIC) 10-12.5 MG tablet, Take 1 tablet by mouth once daily, Disp: 90 tablet, Rfl: 0   Multiple Vitamins-Minerals (MULTIVITAMIN GUMMIES ADULT PO), Take 1 capsule by mouth daily in the afternoon., Disp: , Rfl:    PFIZER COVID-19 VAC BIVALENT injection, , Disp: , Rfl:    simvastatin  (ZOCOR) 20 MG tablet, Take 1 tablet (20 mg total) by mouth daily at 6 PM., Disp: 90 tablet, Rfl: 1  Allergies  Allergen Reactions   Penicillins Rash and Shortness Of Breath   Tetanus Toxoid Adsorbed Rash   Review of Systems Objective:  There were no vitals filed for this visit.  General: Well developed, nourished, in no acute distress, alert and oriented x3   Dermatological: Skin is warm, dry and supple bilateral. Nails x 10 are thick yellow dystrophic clinically mycotic.  Remaining integument appears unremarkable at this time.  Other than a small wound on the dorsal of the second toe which appears to be healing very nicely.  There are no open sores, no preulcerative lesions, she does have what appears to be scalloped erythematous margins extending from her toes into her ankles and legs which demonstrate most likely fungus or yeast infection.   Vascular: Dorsalis Pedis artery and Posterior Tibial artery pedal pulses are 2/4 bilateral with immedate capillary fill time. Pedal hair growth present. No varicosities and no lower extremity edema present bilateral.   Neruologic: Grossly intact via light touch bilateral. Vibratory intact via tuning fork bilateral. Protective threshold with Semmes Wienstein monofilament intact to all pedal sites bilateral. Patellar and Achilles deep tendon reflexes 2+ bilateral. No Babinski or clonus noted bilateral.   Musculoskeletal: No  gross boney pedal deformities bilateral. No pain, crepitus, or limitation noted with foot and ankle range of motion bilateral. Muscular strength 5/5 in all groups tested bilateral.  Severe hallux abductovalgus deformity with overlapping second toe.  Gait: Unassisted, Nonantalgic.    Radiographs:  Radiographs taken today demonstrate osseously mature individual severe hallux valgus deformity of the left foot and overlapping second toe.  Osteoarthritic changes of the midfoot are also noted and osteoarthritic change of the second toe  is noted without signs of infection of the bone.  Assessment & Plan:   Assessment: Tinea pedis or yeast infection of the skin.  Pain in limb secondary to onychomycosis.  Severe hallux valgus deformity hammertoe deformity and open wound appears to be healing second toe left foot.  Plan: Debrided nails today and sent them for sample evaluation.  Started her on terbinafine to help with a yeast and tinea.  She will take 250 mg.  She will take 1 tablet once a day.  If questions or concerns she will call us.  I recommended recommended open toed shoes to help heal the second toe.  She will continue to dress the toe with a Band-Aid and some topical antibiotic ointment.     Khristian Phillippi T. [place], [place]

## 2021-02-17 ENCOUNTER — Telehealth: Payer: Self-pay | Admitting: *Deleted

## 2021-02-17 NOTE — Telephone Encounter (Signed)
Patient is calling because she cannot take the antifungal medication prescribed, after taking for 3 days having bad side effects(headaches, blurred vision,almost fell),stopped taking as of Wed, feel much better. Is there anything else that can be done? Please advise.

## 2021-02-18 MED ORDER — ITRACONAZOLE 100 MG PO CAPS: 100.0000 mg | ORAL_CAPSULE | Freq: Every day | ORAL | 0 refills | Status: DC

## 2021-02-18 NOTE — Telephone Encounter (Signed)
L/M for patient informing her I would send in a change in medication to itraconazole 100mg , taking one daily and if she has any problems with this one to discontinue and let us know.   Sent in itraconazole 100 mg #30 to [facility] on [address].

## 2021-02-22 ENCOUNTER — Telehealth: Payer: Self-pay | Admitting: *Deleted

## 2021-02-22 NOTE — Telephone Encounter (Signed)
Patient's insurance has denied coverage for itraconazole. Can you let her know I sent her a Good Rx coupon to her cell so she can use this and not file her insurance. Just have her present this to the [place] and tell them she will be using the discount card instead of filing insurance. It should be $40 for #30 tablets. She can keep and use this card with additional refills as well.

## 2021-02-23 ENCOUNTER — Telehealth: Payer: Self-pay | Admitting: *Deleted

## 2021-02-23 ENCOUNTER — Other Ambulatory Visit: Payer: Self-pay | Admitting: Podiatry

## 2021-02-23 NOTE — Telephone Encounter (Signed)
Called and left vmessage giving information for patient

## 2021-02-23 NOTE — Telephone Encounter (Signed)
Returned the call to patient giving information /instructions for usage of the Goodrx card sent to phone. Left a voice message of this ,there was no answer to phone.

## 2021-03-15 ENCOUNTER — Ambulatory Visit: Payer: PPO | Admitting: Podiatry

## 2021-03-25 ENCOUNTER — Other Ambulatory Visit: Payer: Self-pay | Admitting: Orthopedic Surgery

## 2021-03-25 DIAGNOSIS — I10 Essential (primary) hypertension: Secondary | ICD-10-CM

## 2021-05-03 ENCOUNTER — Telehealth: Payer: Self-pay

## 2021-05-03 ENCOUNTER — Other Ambulatory Visit: Payer: PPO

## 2021-05-03 ENCOUNTER — Other Ambulatory Visit: Payer: Self-pay

## 2021-05-03 DIAGNOSIS — E7800 Pure hypercholesterolemia, unspecified: Secondary | ICD-10-CM | POA: Diagnosis not present

## 2021-05-03 DIAGNOSIS — Z0000 Encounter for general adult medical examination without abnormal findings: Secondary | ICD-10-CM | POA: Diagnosis not present

## 2021-05-03 DIAGNOSIS — I10 Essential (primary) hypertension: Secondary | ICD-10-CM

## 2021-05-03 DIAGNOSIS — E039 Hypothyroidism, unspecified: Secondary | ICD-10-CM

## 2021-05-03 NOTE — Telephone Encounter (Signed)
Per April, [facility] lab tech, patient was here to get labs this morning and she missed drawing the CBC.   I asked for an explanation to see how to avoid this in the future and April states she is new in her position with [facility] and she did not know where to look to confirm the order and she deeply apologies to [place] and the patient for this oversight.  I called patient and left a detailed message on her voicemail explaining the situation and asked if she could return at her convenience to have CBC drawn.  Side note; I also informed the administrative staff of this encounter to provide insight for them for when the patient returns.

## 2021-05-04 ENCOUNTER — Encounter: Payer: Self-pay | Admitting: Orthopedic Surgery

## 2021-05-04 LAB — COMPREHENSIVE METABOLIC PANEL
AG Ratio: 2.1 (calc) (ref 1.0–2.5)
ALT: 18 U/L (ref 6–29)
AST: 20 U/L (ref 10–35)
Albumin: 4.6 g/dL (ref 3.6–5.1)
Alkaline phosphatase (APISO): 75 U/L (ref 37–153)
BUN: 25 mg/dL (ref 7–25)
CO2: 30 mmol/L (ref 20–32)
Calcium: 9.5 mg/dL (ref 8.6–10.4)
Chloride: 100 mmol/L (ref 98–110)
Creat: 0.85 mg/dL (ref 0.60–1.00)
Globulin: 2.2 g/dL (calc) (ref 1.9–3.7)
Glucose, Bld: 80 mg/dL (ref 65–99)
Potassium: 5.1 mmol/L (ref 3.5–5.3)
Sodium: 137 mmol/L (ref 135–146)
Total Bilirubin: 0.6 mg/dL (ref 0.2–1.2)
Total Protein: 6.8 g/dL (ref 6.1–8.1)

## 2021-05-04 LAB — LIPID PANEL
Cholesterol: 155 mg/dL (ref <200)
HDL: 61 mg/dL (ref >=50)
LDL Cholesterol (Calc): 72 mg/dL (calc)
Non-HDL Cholesterol (Calc): 94 mg/dL (calc) (ref <130)
Total CHOL/HDL Ratio: 2.5 (calc) (ref <5.0)
Triglycerides: 132 mg/dL (ref <150)

## 2021-05-04 LAB — TSH: TSH: 2.91 mIU/L (ref 0.40–4.50)

## 2021-05-05 ENCOUNTER — Ambulatory Visit (INDEPENDENT_AMBULATORY_CARE_PROVIDER_SITE_OTHER): Payer: PPO | Admitting: Orthopedic Surgery

## 2021-05-05 ENCOUNTER — Other Ambulatory Visit: Payer: Self-pay

## 2021-05-05 ENCOUNTER — Encounter: Payer: Self-pay | Admitting: Orthopedic Surgery

## 2021-05-05 VITALS — BP 126/74 | HR 79 | Temp 97.7°F | Ht 61.0 in | Wt 137.8 lb

## 2021-05-05 DIAGNOSIS — M79675 Pain in left toe(s): Secondary | ICD-10-CM

## 2021-05-05 DIAGNOSIS — F329 Major depressive disorder, single episode, unspecified: Secondary | ICD-10-CM

## 2021-05-05 DIAGNOSIS — G8929 Other chronic pain: Secondary | ICD-10-CM | POA: Diagnosis not present

## 2021-05-05 DIAGNOSIS — E7800 Pure hypercholesterolemia, unspecified: Secondary | ICD-10-CM | POA: Diagnosis not present

## 2021-05-05 DIAGNOSIS — M545 Low back pain, unspecified: Secondary | ICD-10-CM

## 2021-05-05 DIAGNOSIS — E039 Hypothyroidism, unspecified: Secondary | ICD-10-CM

## 2021-05-05 DIAGNOSIS — I10 Essential (primary) hypertension: Secondary | ICD-10-CM | POA: Diagnosis not present

## 2021-05-05 MED ORDER — GABAPENTIN 100 MG PO CAPS: 100.0000 mg | ORAL_CAPSULE | Freq: Every day | ORAL | 3 refills | Status: DC

## 2021-05-05 NOTE — Patient Instructions (Addendum)
May try Tea tree oil for nail fungus  Prescription sent for gabapentin- take at night- may cause drowsiness- may help with toe pain  Please ask pharmacy about Prevnar 20 vaccine/ pneumonia vaccine

## 2021-05-05 NOTE — Progress Notes (Signed)
Careteam: Patient Care Team: Terri Alanis, NP as PCP - General (Adult Health Nurse Practitioner) [name], MD as Consulting Physician (Ophthalmology)  Seen by: Windell Moulding, AGNP-C  PLACE OF SERVICE:  [place] Directive information Does Patient Have a Medical Advance Directive?: Yes, Type of Advance Directive: [place], Does patient want to make changes to medical advance directive?: No - Patient declined  Allergies  Allergen Reactions   Penicillins Rash and Shortness Of Breath   Terbinafine And Related     Headache, blurred vision, dizziness   Tetanus Toxoid Adsorbed Rash    Chief Complaint  Patient presents with   Medical Management of Chronic Issues    6 month Follow up. Discuss the need for colonoscopy, additional COVID booster, and Pneumonia vaccine,or post pone if patient refuses.      HPI: Patient is a 76 y.o. female seen today for medical management of chronic conditions.   Lab results discussed with patient.   02/2021 she stubbed her toe at home. She went to see podiatry due to shooting/burning pain. She expected pain medication but was not given any. She was also given terbinafine fir fungus, but stopped taking it after awhile- does not believe it worked. Pain has improved overtime, but she is still having burning pain about 4x/daily- shoes exacerbate pain.   Back pain ongoing. She continues to be active and attends [employer] and silver sneakers often.   No recent falls or injuries.   Still having trouble managing household after husband passed away last year. She is also still dealing with his estate. She reports feeling lonely. She has a few girlfriends who she sees occasionally. Trial of Zoloft unsuccessful in the past.   Discussed Prevnar vaccine- she will go to pharmacy to get.   She does not want any future colonoscopy.    Review of Systems:  Review of Systems  Constitutional: Negative.   HENT: Negative.    Eyes:  Negative.   Respiratory:  Negative for cough, shortness of breath and wheezing.   Cardiovascular:  Negative for chest pain and leg swelling.  Gastrointestinal: Negative.   Genitourinary: Negative.   Musculoskeletal:  Positive for joint pain. Negative for falls.  Skin: Negative.   Neurological:  Negative for dizziness.  Psychiatric/Behavioral:  Positive for depression. Negative for memory loss. The patient is not nervous/anxious and does not have insomnia.    Past Medical History:  Diagnosis Date   High cholesterol    History of bone density study    History of mammogram 2021   Hypertension    Hypothyroidism    left Toe pain 12/11/2020   Per patient   Past Surgical History:  Procedure Laterality Date   OTHER SURGICAL HISTORY     Cataracts Surgery by Dr.Hecker   Social History:   reports that she has never smoked. She has never used smokeless tobacco. She reports that she does not drink alcohol and does not use drugs.  Family History  Problem Relation Age of Onset   Cancer Mother    Congestive Heart Failure Father     Medications: Patient's Medications  New Prescriptions   No medications on file  Previous Medications   ACETAMINOPHEN (TYLENOL) 500 MG TABLET    Take by mouth daily as needed. 1-2 tablets   ASPIRIN 81 MG CHEWABLE TABLET    Chew 81 mg by mouth daily.   CALCIUM CARBONATE-VIT D-MIN (CALCIUM 1200 PO)    Take 1 tablet by mouth daily.  LEVOTHYROXINE (EUTHYROX) 75 MCG TABLET    Take 1 tablet (75 mcg total) by mouth daily.   LISINOPRIL-HYDROCHLOROTHIAZIDE (ZESTORETIC) 10-12.5 MG TABLET    Take 1 tablet by mouth once daily   MULTIPLE VITAMINS-MINERALS (MULTIVITAMIN GUMMIES ADULT PO)    Take 1 capsule by mouth daily in the afternoon.   PFIZER COVID-19 VAC BIVALENT INJECTION       SIMVASTATIN (ZOCOR) 20 MG TABLET    Take 1 tablet (20 mg total) by mouth daily at 6 PM.  Modified Medications   No medications on file  Discontinued Medications   FLUZONE HIGH-DOSE  QUADRIVALENT 0.7 ML SUSY       ITRACONAZOLE (SPORANOX) 100 MG CAPSULE    Take 1 capsule (100 mg total) by mouth daily.    Physical Exam:  Vitals:   05/05/21 1030  BP: 126/74  Pulse: 79  Temp: 97.7 F (36.5 C)  SpO2: 90%  Weight: 137 lb 12.8 oz (62.5 kg)  Height: 5\' 1"  (1.549 m)   Body mass index is 26.04 kg/m. Wt Readings from Last 3 Encounters:  05/05/21 137 lb 12.8 oz (62.5 kg)  10/28/20 138 lb 6.4 oz (62.8 kg)  05/18/20 149 lb 3.2 oz (67.7 kg)    Physical Exam Vitals reviewed.  Constitutional:      General: She is not in acute distress. HENT:     Head: Normocephalic.  Eyes:     General:        Right eye: No discharge.        Left eye: No discharge.  Cardiovascular:     Rate and Rhythm: Normal rate and regular rhythm.     Pulses:          Dorsalis pedis pulses are 1+ on the right side and 1+ on the left side.     Heart sounds: Normal heart sounds. No murmur heard. Pulmonary:     Effort: Pulmonary effort is normal. No respiratory distress.     Breath sounds: Normal breath sounds. No wheezing.  Abdominal:     General: Bowel sounds are normal. There is no distension.     Palpations: Abdomen is soft.     Tenderness: There is no abdominal tenderness.  Musculoskeletal:     Cervical back: Neck supple.     Right lower leg: No edema.     Left lower leg: No edema.     Right foot: Decreased range of motion.     Left foot: Normal range of motion.  Feet:     Right foot:     Protective Sensation: 10 sites tested.  10 sites sensed.     Skin integrity: Skin integrity normal. No skin breakdown.     Toenail Condition: Right toenails are abnormally thick. Fungal disease present.    Left foot:     Protective Sensation: 10 sites tested.  10 sites sensed.     Skin integrity: Skin integrity normal. No skin breakdown.     Toenail Condition: Left toenails are abnormally thick. Fungal disease present.    Comments: 2nd toe on left foot with hammer toe Skin:    General: Skin is  warm and dry.     Capillary Refill: Capillary refill takes less than 2 seconds.  Neurological:     General: No focal deficit present.     Mental Status: She is alert and oriented to person, place, and time.     Motor: No weakness.     Gait: Gait normal.  Psychiatric:  Mood and Affect: Mood normal.        Behavior: Behavior normal.    Labs reviewed: Basic Metabolic Panel: Recent Labs    07/22/20 0947 10/21/20 0829 05/03/21 0829  NA  --   --  137  K  --   --  5.1  CL  --   --  100  CO2  --   --  30  GLUCOSE  --   --  80  BUN  --   --  25  CREATININE  --   --  0.85  CALCIUM  --   --  9.5  TSH 1.82 1.30 2.91   Liver Function Tests: Recent Labs    05/03/21 0829  AST 20  ALT 18  BILITOT 0.6  PROT 6.8   No results for input(s): LIPASE, AMYLASE in the last 8760 hours. No results for input(s): AMMONIA in the last 8760 hours. CBC: No results for input(s): WBC, NEUTROABS, HGB, HCT, MCV, PLT in the last 8760 hours. Lipid Panel: Recent Labs    05/03/21 0829  CHOL 155  HDL 61  LDLCALC 72  TRIG 132  CHOLHDL 2.5   TSH: Recent Labs    07/22/20 0947 10/21/20 0829 05/03/21 0829  TSH 1.82 1.30 2.91   A1C: No results found for: HGBA1C   Assessment/Plan 1. Toe pain, left - seen by podiatry after stubbing toe - hammer toe to second toe - will trial gabapentin for toe and back pain - gabapentin (NEURONTIN) 100 MG capsule; Take 1 capsule (100 mg total) by mouth at bedtime.  Dispense: 30 capsule; Refill: 3  2. Acquired hypothyroidism - TSH normal  - cont levothyroxine  3. Essential hypertension - controlled - cont lisinopril-HCTZ  4. Pure hypercholesterolemia - LDL 72- at goal - cont simvastatin  5. Reactive depression - ongoing - unsuccessful trial of Zoloft  6. Chronic midline low back pain without sciatica - ongoing - cont tylenol prn for pain - will trial gabapentin for pain  Total time: 36 minutes. Greater than 50% of total time spent doing  patient education regarding health maintenance, hypothyroidism, back pain, and depression.   Next appt: 10/13/2021  Windell Moulding, [facility] [phone]

## 2021-05-09 ENCOUNTER — Other Ambulatory Visit: Payer: Self-pay | Admitting: Orthopedic Surgery

## 2021-05-09 DIAGNOSIS — E7800 Pure hypercholesterolemia, unspecified: Secondary | ICD-10-CM

## 2021-08-23 ENCOUNTER — Encounter: Payer: Self-pay | Admitting: Podiatry

## 2021-08-23 ENCOUNTER — Ambulatory Visit: Payer: PPO | Admitting: Podiatry

## 2021-08-23 DIAGNOSIS — M79674 Pain in right toe(s): Secondary | ICD-10-CM | POA: Diagnosis not present

## 2021-08-23 DIAGNOSIS — M79675 Pain in left toe(s): Secondary | ICD-10-CM

## 2021-08-23 DIAGNOSIS — B351 Tinea unguium: Secondary | ICD-10-CM

## 2021-08-23 DIAGNOSIS — L84 Corns and callosities: Secondary | ICD-10-CM | POA: Diagnosis not present

## 2021-08-23 NOTE — Progress Notes (Signed)
This patient presents to the office with chief complaint of long thick painful nails.  Patient says the nails are painful walking and wearing shoes.  This patient is unable to self treat.  This patient is unable to trim her nails since she is unable to reach her nails.  She presents to the office for preventative foot care services.  General Appearance  Alert, conversant and in no acute stress.  Vascular  Dorsalis pedis and posterior tibial  pulses are palpable  bilaterally.  Capillary return is within normal limits  bilaterally. Temperature is within normal limits  bilaterally.  Neurologic  Senn-Weinstein monofilament wire test within normal limits  bilaterally. Muscle power within normal limits bilaterally.  Nails Thick disfigured discolored nails with subungual debris  from hallux to fifth toes bilaterally. No evidence of bacterial infection or drainage bilaterally.  Orthopedic  No limitations of motion  feet .  No crepitus or effusions noted. 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  Skin  normotropic skin with no porokeratosis noted bilaterally.  No signs of infections or ulcers noted.   Callus 1/2 left foot.  Asymptomatic callus left heel.  Onychomycosis  Nails  B/L.  Pain in right toes  Pain in left toes  Callus left foot.  Debridement of nails both feet with nail nipper  followed by trimming the nails with dremel tool. Debride callus with # 15 blade followed by dremel tool usage.   RTC 3 months.   Maudell Stanbrough DPM   

## 2021-09-14 ENCOUNTER — Other Ambulatory Visit: Payer: Self-pay | Admitting: Orthopedic Surgery

## 2021-09-14 DIAGNOSIS — H04123 Dry eye syndrome of bilateral lacrimal glands: Secondary | ICD-10-CM | POA: Diagnosis not present

## 2021-09-14 DIAGNOSIS — I10 Essential (primary) hypertension: Secondary | ICD-10-CM

## 2021-09-14 DIAGNOSIS — H35341 Macular cyst, hole, or pseudohole, right eye: Secondary | ICD-10-CM | POA: Diagnosis not present

## 2021-09-14 DIAGNOSIS — H524 Presbyopia: Secondary | ICD-10-CM | POA: Diagnosis not present

## 2021-09-14 DIAGNOSIS — H26493 Other secondary cataract, bilateral: Secondary | ICD-10-CM | POA: Diagnosis not present

## 2021-09-14 DIAGNOSIS — H43393 Other vitreous opacities, bilateral: Secondary | ICD-10-CM | POA: Diagnosis not present

## 2021-09-21 ENCOUNTER — Other Ambulatory Visit: Payer: Self-pay | Admitting: Orthopedic Surgery

## 2021-09-21 DIAGNOSIS — M79675 Pain in left toe(s): Secondary | ICD-10-CM

## 2021-09-22 ENCOUNTER — Emergency Department (HOSPITAL_COMMUNITY): Payer: PPO

## 2021-09-22 ENCOUNTER — Inpatient Hospital Stay (HOSPITAL_COMMUNITY)
Admission: EM | Admit: 2021-09-22 | Discharge: 2021-09-26 | DRG: 326 | Disposition: A | Discharge status: Home Health | Payer: PPO | Attending: Surgery | Admitting: Surgery

## 2021-09-22 ENCOUNTER — Other Ambulatory Visit: Payer: Self-pay

## 2021-09-22 ENCOUNTER — Encounter (HOSPITAL_COMMUNITY): Payer: Self-pay

## 2021-09-22 DIAGNOSIS — E039 Hypothyroidism, unspecified: Secondary | ICD-10-CM | POA: Diagnosis present

## 2021-09-22 DIAGNOSIS — K5730 Diverticulosis of large intestine without perforation or abscess without bleeding: Secondary | ICD-10-CM | POA: Diagnosis not present

## 2021-09-22 DIAGNOSIS — I10 Essential (primary) hypertension: Secondary | ICD-10-CM | POA: Diagnosis not present

## 2021-09-22 DIAGNOSIS — R079 Chest pain, unspecified: Secondary | ICD-10-CM | POA: Diagnosis not present

## 2021-09-22 DIAGNOSIS — Z888 Allergy status to other drugs, medicaments and biological substances status: Secondary | ICD-10-CM | POA: Diagnosis not present

## 2021-09-22 DIAGNOSIS — K3189 Other diseases of stomach and duodenum: Secondary | ICD-10-CM | POA: Diagnosis not present

## 2021-09-22 DIAGNOSIS — E785 Hyperlipidemia, unspecified: Secondary | ICD-10-CM | POA: Diagnosis not present

## 2021-09-22 DIAGNOSIS — Z79890 Hormone replacement therapy (postmenopausal): Secondary | ICD-10-CM

## 2021-09-22 DIAGNOSIS — Z880 Allergy status to penicillin: Secondary | ICD-10-CM | POA: Diagnosis not present

## 2021-09-22 DIAGNOSIS — R0789 Other chest pain: Secondary | ICD-10-CM | POA: Diagnosis not present

## 2021-09-22 DIAGNOSIS — J309 Allergic rhinitis, unspecified: Secondary | ICD-10-CM | POA: Diagnosis not present

## 2021-09-22 DIAGNOSIS — M419 Scoliosis, unspecified: Secondary | ICD-10-CM | POA: Diagnosis not present

## 2021-09-22 DIAGNOSIS — Z79899 Other long term (current) drug therapy: Secondary | ICD-10-CM

## 2021-09-22 DIAGNOSIS — K449 Diaphragmatic hernia without obstruction or gangrene: Secondary | ICD-10-CM | POA: Diagnosis not present

## 2021-09-22 DIAGNOSIS — K440 Diaphragmatic hernia with obstruction, without gangrene: Secondary | ICD-10-CM | POA: Diagnosis not present

## 2021-09-22 DIAGNOSIS — K219 Gastro-esophageal reflux disease without esophagitis: Secondary | ICD-10-CM | POA: Diagnosis present

## 2021-09-22 DIAGNOSIS — R112 Nausea with vomiting, unspecified: Secondary | ICD-10-CM | POA: Diagnosis not present

## 2021-09-22 DIAGNOSIS — K311 Adult hypertrophic pyloric stenosis: Secondary | ICD-10-CM | POA: Diagnosis not present

## 2021-09-22 DIAGNOSIS — E7800 Pure hypercholesterolemia, unspecified: Secondary | ICD-10-CM | POA: Diagnosis present

## 2021-09-22 DIAGNOSIS — Z7982 Long term (current) use of aspirin: Secondary | ICD-10-CM | POA: Diagnosis not present

## 2021-09-22 DIAGNOSIS — K562 Volvulus: Secondary | ICD-10-CM | POA: Diagnosis not present

## 2021-09-22 DIAGNOSIS — Z4682 Encounter for fitting and adjustment of non-vascular catheter: Secondary | ICD-10-CM | POA: Diagnosis not present

## 2021-09-22 DIAGNOSIS — R1111 Vomiting without nausea: Secondary | ICD-10-CM | POA: Diagnosis not present

## 2021-09-22 DIAGNOSIS — K760 Fatty (change of) liver, not elsewhere classified: Secondary | ICD-10-CM | POA: Diagnosis not present

## 2021-09-22 DIAGNOSIS — E876 Hypokalemia: Secondary | ICD-10-CM | POA: Diagnosis not present

## 2021-09-22 DIAGNOSIS — I499 Cardiac arrhythmia, unspecified: Secondary | ICD-10-CM | POA: Diagnosis not present

## 2021-09-22 DIAGNOSIS — M549 Dorsalgia, unspecified: Secondary | ICD-10-CM | POA: Diagnosis not present

## 2021-09-22 LAB — COMPREHENSIVE METABOLIC PANEL
ALT: 22 U/L (ref 0–44)
AST: 31 U/L (ref 15–41)
Albumin: 4.1 g/dL (ref 3.5–5.0)
Alkaline Phosphatase: 66 U/L (ref 38–126)
Anion gap: 11 (ref 5–15)
BUN: 29 mg/dL — ABNORMAL HIGH (ref 8–23)
CO2: 29 mmol/L (ref 22–32)
Calcium: 8.9 mg/dL (ref 8.9–10.3)
Chloride: 103 mmol/L (ref 98–111)
Creatinine, Ser: 0.83 mg/dL (ref 0.44–1.00)
GFR, Estimated: >60 mL/min (ref >60)
Glucose, Bld: 131 mg/dL — ABNORMAL HIGH (ref 70–99)
Potassium: 3.3 mmol/L — ABNORMAL LOW (ref 3.5–5.1)
Sodium: 143 mmol/L (ref 135–145)
Total Bilirubin: 0.7 mg/dL (ref 0.3–1.2)
Total Protein: 7.2 g/dL (ref 6.5–8.1)

## 2021-09-22 LAB — CBC WITH DIFFERENTIAL/PLATELET
Abs Immature Granulocytes: 0.02 10*3/uL (ref 0.00–0.07)
Basophils Absolute: 0 10*3/uL (ref 0.0–0.1)
Basophils Relative: 0 %
Eosinophils Absolute: 0 10*3/uL (ref 0.0–0.5)
Eosinophils Relative: 0 %
HCT: 47.9 % — ABNORMAL HIGH (ref 36.0–46.0)
Hemoglobin: 16.6 g/dL — ABNORMAL HIGH (ref 12.0–15.0)
Immature Granulocytes: 0 %
Lymphocytes Relative: 14 %
Lymphs Abs: 1.4 10*3/uL (ref 0.7–4.0)
MCH: 32.2 pg (ref 26.0–34.0)
MCHC: 34.7 g/dL (ref 30.0–36.0)
MCV: 92.8 fL (ref 80.0–100.0)
Monocytes Absolute: 0.7 10*3/uL (ref 0.1–1.0)
Monocytes Relative: 7 %
Neutro Abs: 7.5 10*3/uL (ref 1.7–7.7)
Neutrophils Relative %: 79 %
Platelets: 262 10*3/uL (ref 150–400)
RBC: 5.16 MIL/uL — ABNORMAL HIGH (ref 3.87–5.11)
RDW: 13.5 % (ref 11.5–15.5)
WBC: 9.6 10*3/uL (ref 4.0–10.5)
nRBC: 0 % (ref 0.0–0.2)

## 2021-09-22 LAB — LIPASE, BLOOD: Lipase: 40 U/L (ref 11–51)

## 2021-09-22 LAB — TROPONIN I (HIGH SENSITIVITY)
Troponin I (High Sensitivity): 8 ng/L (ref <18)
Troponin I (High Sensitivity): 9 ng/L (ref <18)

## 2021-09-22 LAB — SURGICAL PCR SCREEN
MRSA, PCR: NEGATIVE
Staphylococcus aureus: NEGATIVE

## 2021-09-22 MED ORDER — IOHEXOL 350 MG/ML SOLN
100.0000 mL | Freq: Once | INTRAVENOUS | Status: AC | PRN
Start: 2021-09-22 — End: 2021-09-22
  Administered 2021-09-22: 100 mL via INTRAVENOUS

## 2021-09-22 MED ORDER — SODIUM CHLORIDE 0.9 % IV SOLN
1.0000 g | INTRAVENOUS | Status: AC
  Administered 2021-09-23: 1 g via INTRAVENOUS
  Filled 2021-09-22: qty 1

## 2021-09-22 MED ORDER — ONDANSETRON HCL 4 MG/2ML IJ SOLN: 4.0000 mg | Freq: Four times a day (QID) | INTRAMUSCULAR | Status: DC | PRN

## 2021-09-22 MED ORDER — LEVOTHYROXINE SODIUM 100 MCG/5ML IV SOLN: 56.0000 ug | Freq: Every day | INTRAVENOUS | Status: DC

## 2021-09-22 MED ORDER — PROCHLORPERAZINE EDISYLATE 10 MG/2ML IJ SOLN: 5.0000 mg | INTRAMUSCULAR | Status: DC | PRN

## 2021-09-22 MED ORDER — DEXTROSE 5 % IV SOLN: 500.0000 mg | Freq: Three times a day (TID) | INTRAVENOUS | Status: DC | PRN

## 2021-09-22 MED ORDER — CHLORHEXIDINE GLUCONATE CLOTH 2 % EX PADS
6.0000 | MEDICATED_PAD | Freq: Once | CUTANEOUS | Status: AC
  Administered 2021-09-22: 6 via TOPICAL

## 2021-09-22 MED ORDER — ENSURE PRE-SURGERY PO LIQD
296.0000 mL | Freq: Once | ORAL | Status: DC
  Filled 2021-09-22: qty 296

## 2021-09-22 MED ORDER — SODIUM CHLORIDE 0.9 % IV SOLN: INTRAVENOUS | Status: DC

## 2021-09-22 MED ORDER — PANTOPRAZOLE SODIUM 40 MG IV SOLR
40.0000 mg | Freq: Every day | INTRAVENOUS | Status: DC
Start: 2021-09-22 — End: 2021-09-26
  Administered 2021-09-22 – 2021-09-25 (×4): 40 mg via INTRAVENOUS
  Filled 2021-09-22 (×4): qty 10

## 2021-09-22 MED ORDER — DIPHENHYDRAMINE HCL 50 MG/ML IJ SOLN: 12.5000 mg | Freq: Four times a day (QID) | INTRAMUSCULAR | Status: DC | PRN

## 2021-09-22 MED ORDER — LIP MEDEX EX OINT
TOPICAL_OINTMENT | Freq: Two times a day (BID) | CUTANEOUS | Status: DC
  Administered 2021-09-22 – 2021-09-24 (×3): 75 via TOPICAL
  Administered 2021-09-24 – 2021-09-25 (×2): 1 via TOPICAL
  Administered 2021-09-26: 75 via TOPICAL
  Filled 2021-09-22 (×4): qty 7

## 2021-09-22 MED ORDER — PHENOL 1.4 % MT LIQD: 2.0000 | OROMUCOSAL | Status: DC | PRN

## 2021-09-22 MED ORDER — METOPROLOL TARTRATE 5 MG/5ML IV SOLN: 5.0000 mg | Freq: Four times a day (QID) | INTRAVENOUS | Status: DC | PRN

## 2021-09-22 MED ORDER — MAGIC MOUTHWASH: 15.0000 mL | Freq: Four times a day (QID) | ORAL | Status: DC | PRN

## 2021-09-22 MED ORDER — SCOPOLAMINE 1 MG/3DAYS TD PT72
1.0000 | MEDICATED_PATCH | TRANSDERMAL | Status: DC
  Administered 2021-09-23: 1.5 mg via TRANSDERMAL
  Filled 2021-09-22: qty 1

## 2021-09-22 MED ORDER — MENTHOL 3 MG MT LOZG: 1.0000 | LOZENGE | OROMUCOSAL | Status: DC | PRN

## 2021-09-22 MED ORDER — MORPHINE SULFATE (PF) 2 MG/ML IV SOLN: 2.0000 mg | INTRAVENOUS | Status: DC | PRN

## 2021-09-22 MED ORDER — CHLORHEXIDINE GLUCONATE CLOTH 2 % EX PADS
6.0000 | MEDICATED_PAD | Freq: Once | CUTANEOUS | Status: AC
  Administered 2021-09-23: 6 via TOPICAL

## 2021-09-22 MED ORDER — ENALAPRILAT 1.25 MG/ML IV SOLN: 0.6250 mg | Freq: Four times a day (QID) | INTRAVENOUS | Status: DC | PRN

## 2021-09-22 MED ORDER — PROCHLORPERAZINE MALEATE 10 MG PO TABS: 10.0000 mg | ORAL_TABLET | Freq: Four times a day (QID) | ORAL | Status: DC | PRN

## 2021-09-22 MED ORDER — PROCHLORPERAZINE EDISYLATE 10 MG/2ML IJ SOLN
5.0000 mg | Freq: Four times a day (QID) | INTRAMUSCULAR | Status: DC | PRN
  Administered 2021-09-22: 10 mg via INTRAVENOUS
  Filled 2021-09-22: qty 2

## 2021-09-22 MED ORDER — SODIUM CHLORIDE (PF) 0.9 % IJ SOLN
INTRAMUSCULAR | Status: AC
  Filled 2021-09-22: qty 50

## 2021-09-22 MED ORDER — LACTATED RINGERS IV BOLUS: 1000.0000 mL | Freq: Three times a day (TID) | INTRAVENOUS | Status: AC | PRN

## 2021-09-22 MED ORDER — GABAPENTIN 300 MG PO CAPS: 300.0000 mg | ORAL_CAPSULE | ORAL | Status: DC

## 2021-09-22 MED ORDER — HYDRALAZINE HCL 20 MG/ML IJ SOLN: 10.0000 mg | INTRAMUSCULAR | Status: DC | PRN

## 2021-09-22 MED ORDER — BISACODYL 10 MG RE SUPP
10.0000 mg | Freq: Every day | RECTAL | Status: DC
  Filled 2021-09-22 (×2): qty 1

## 2021-09-22 MED ORDER — DIPHENHYDRAMINE HCL 12.5 MG/5ML PO ELIX: 12.5000 mg | ORAL_SOLUTION | Freq: Four times a day (QID) | ORAL | Status: DC | PRN

## 2021-09-22 MED ORDER — METHOCARBAMOL 1000 MG/10ML IJ SOLN: 1000.0000 mg | Freq: Four times a day (QID) | INTRAVENOUS | Status: DC | PRN

## 2021-09-22 MED ORDER — ENSURE PRE-SURGERY PO LIQD
592.0000 mL | Freq: Once | ORAL | Status: DC
  Filled 2021-09-22: qty 592

## 2021-09-22 MED ORDER — LACTATED RINGERS IV BOLUS
1000.0000 mL | Freq: Once | INTRAVENOUS | Status: AC
  Administered 2021-09-22: 1000 mL via INTRAVENOUS

## 2021-09-22 MED ORDER — METOCLOPRAMIDE HCL 5 MG/ML IJ SOLN
5.0000 mg | Freq: Once | INTRAMUSCULAR | Status: AC
  Administered 2021-09-22: 5 mg via INTRAVENOUS
  Filled 2021-09-22: qty 2

## 2021-09-22 MED ORDER — ACETAMINOPHEN 500 MG PO TABS: 1000.0000 mg | ORAL_TABLET | ORAL | Status: DC

## 2021-09-22 MED ORDER — ALUM & MAG HYDROXIDE-SIMETH 200-200-20 MG/5ML PO SUSP: 30.0000 mL | Freq: Four times a day (QID) | ORAL | Status: DC | PRN

## 2021-09-22 MED ORDER — METHOCARBAMOL 500 MG PO TABS: 500.0000 mg | ORAL_TABLET | Freq: Three times a day (TID) | ORAL | Status: DC | PRN

## 2021-09-22 MED ORDER — PNEUMOCOCCAL 20-VAL CONJ VACC 0.5 ML IM SUSY: 0.5000 mL | PREFILLED_SYRINGE | INTRAMUSCULAR | Status: DC | PRN

## 2021-09-22 MED ORDER — DEXAMETHASONE SODIUM PHOSPHATE 4 MG/ML IJ SOLN
4.0000 mg | INTRAMUSCULAR | Status: DC
  Filled 2021-09-22: qty 1

## 2021-09-22 MED ORDER — POTASSIUM CHLORIDE IN NACL 20-0.45 MEQ/L-% IV SOLN
INTRAVENOUS | Status: DC
Start: 2021-09-22 — End: 2021-09-25
  Filled 2021-09-22 (×7): qty 1000

## 2021-09-22 MED ORDER — SODIUM CHLORIDE 0.9 % IV SOLN: 8.0000 mg | Freq: Four times a day (QID) | INTRAVENOUS | Status: DC | PRN

## 2021-09-22 MED ORDER — METOCLOPRAMIDE HCL 5 MG/ML IJ SOLN: 5.0000 mg | Freq: Three times a day (TID) | INTRAMUSCULAR | Status: DC | PRN

## 2021-09-22 MED ORDER — ONDANSETRON 4 MG PO TBDP: 4.0000 mg | ORAL_TABLET | Freq: Four times a day (QID) | ORAL | Status: DC | PRN

## 2021-09-22 MED ORDER — BUPIVACAINE LIPOSOME 1.3 % IJ SUSP: 20.0000 mL | Freq: Once | INTRAMUSCULAR | Status: DC

## 2021-09-22 MED ORDER — ONDANSETRON HCL 4 MG/2ML IJ SOLN
4.0000 mg | Freq: Once | INTRAMUSCULAR | Status: AC
  Administered 2021-09-22: 4 mg via INTRAVENOUS
  Filled 2021-09-22: qty 2

## 2021-09-22 NOTE — ED Provider Notes (Signed)
[facility] DEPT Provider Note   CSN: [id number] Arrival date & time: 09/22/21  7096     History  No chief complaint on file.   Terri Mcgee is a 76 y.o. female.  76 year old female presents with 1 day of constant pain in her chest going to her back.  Patient states her pain is worse with movement and taking deep breath.  Denies any fever cough congestion.  No exertional quality to it.  No prior history of same.  Does have a history of reflux and felt that this might be similar to it but take an acids and throughout the with response.  States that her emesis has been nonbilious and bloody.  She has not had any black or bloody stools.  No fever or chills.  Denies any syncope or near syncope.  No neurological features with this.  Denies any diarrhea.  Called EMS and was given 4 mg of Zofran and felt slightly better       Home Medications Prior to Admission medications   Medication Sig Start Date End Date Taking? Authorizing Provider  acetaminophen (TYLENOL) 500 MG tablet Take by mouth daily as needed. 1-2 tablets    [provider]  aspirin 81 MG chewable tablet Chew 81 mg by mouth daily.    [provider]  Calcium Carbonate-Vit D-Min (CALCIUM 1200 PO) Take 1 tablet by mouth daily.    [provider]  gabapentin (NEURONTIN) 100 MG capsule Take 1 capsule by mouth at bedtime 09/21/21   Fargo, Amy E, NP  levothyroxine (EUTHYROX) 75 MCG tablet Take 1 tablet (75 mcg total) by mouth daily. 12/23/20   Yvonna Alanis, NP  lisinopril-hydrochlorothiazide (ZESTORETIC) 10-12.5 MG tablet Take 1 tablet by mouth once daily 09/14/21   Fargo, Amy E, NP  Multiple Vitamins-Minerals (MULTIVITAMIN GUMMIES ADULT PO) Take 1 capsule by mouth daily in the afternoon.    [provider]  PFIZER COVID-19 VAC BIVALENT injection  12/31/20   [provider]  simvastatin (ZOCOR) 20 MG tablet TAKE 1 TABLET BY MOUTH ONCE DAILY AT 6 PM 05/09/21    Fargo, Amy E, NP      Allergies    Penicillins, Terbinafine and related, and Tetanus toxoid adsorbed    Review of Systems   Review of Systems  All other systems reviewed and are negative.   Physical Exam Updated Vital Signs Ht 1.549 m ('5\' 1"'$ )   Wt 63 kg   BMI 26.24 kg/m  Physical Exam Vitals and nursing note reviewed.  Constitutional:      General: She is not in acute distress.    Appearance: Normal appearance. She is well-developed. She is not toxic-appearing.  HENT:     Head: Normocephalic and atraumatic.  Eyes:     General: Lids are normal.     Conjunctiva/sclera: Conjunctivae normal.     Pupils: Pupils are equal, round, and reactive to light.  Neck:     Thyroid: No thyroid mass.     Trachea: No tracheal deviation.  Cardiovascular:     Rate and Rhythm: Normal rate and regular rhythm.     Heart sounds: Normal heart sounds. No murmur heard.    No gallop.  Pulmonary:     Effort: Pulmonary effort is normal. No respiratory distress.     Breath sounds: Normal breath sounds. No stridor. No decreased breath sounds, wheezing, rhonchi or rales.  Abdominal:     General: There is no distension.     Palpations:  Abdomen is soft.     Tenderness: There is no abdominal tenderness. There is no rebound.  Musculoskeletal:        General: No tenderness. Normal range of motion.     Cervical back: Normal range of motion and neck supple.  Skin:    General: Skin is warm and dry.     Findings: No abrasion or rash.  Neurological:     Mental Status: She is alert and oriented to person, place, and time. Mental status is at baseline.     GCS: GCS eye subscore is 4. GCS verbal subscore is 5. GCS motor subscore is 6.     Cranial Nerves: No cranial nerve deficit.     Sensory: No sensory deficit.     Motor: Motor function is intact.  Psychiatric:        Attention and Perception: Attention normal.        Speech: Speech normal.        Behavior: Behavior normal.     ED Results / Procedures  / Treatments   Labs (all labs ordered are listed, but only abnormal results are displayed) Labs Reviewed  CBC WITH DIFFERENTIAL/PLATELET  COMPREHENSIVE METABOLIC PANEL  LIPASE, BLOOD  TROPONIN I (HIGH SENSITIVITY)    EKG None  Radiology No results found.  Procedures Procedures    Medications Ordered in ED Medications  0.9 %  sodium chloride infusion (has no administration in time range)    ED Course/ Medical Decision Making/ A&P                           Medical Decision Making Amount and/or Complexity of Data Reviewed Labs: ordered. Radiology: ordered. ECG/medicine tests: ordered.  Risk Prescription drug management. Decision regarding hospitalization.   Patient presented with vomiting concerning for possible dissection as she had pain in her chest and abdomen going through to her back.  She was hypertensive initially.  Given antiemetics and pain medication here.  Chest x-ray per my interpretation did not show any acute findings.  She subsequently had a CT angio of her chest, abdomen, pelvis which demonstrated a gastric volvulus.  She will have an NG tube placed.  Consult to general surgery placed   1:19 PM Patient seen by general surgery and plans for admission        Final Clinical Impression(s) / ED Diagnoses Final diagnoses:  None    Rx / DC Orders ED Discharge Orders     None         Lacretia Leigh, MD 09/22/21 1319

## 2021-09-22 NOTE — ED Notes (Signed)
61 F gastric tube with suction at intermittent placed at 11:50

## 2021-09-22 NOTE — ED Triage Notes (Signed)
Patient BIB EMS from home. Patient c/o N/V for 1 day. Patient states she has some chest pain associated with vomiting. Patient states she has lost count of how many times she has vomited. EMS placed 20g in left hand and gave '4mg'$  of zofran.   169/100 90-HR 97% room air  111-CBG

## 2021-09-22 NOTE — H&P (Signed)
[facility] Admission Note  [name] 1945-11-24  [id number].    Requesting MD: Lacretia Leigh Chief Complaint/Reason for Consult: gastric volvulus  HPI:  Terri Mcgee is a 76 y.o. female PMH HTN, HLD, hypothyroidism who presented to [facility] via EMS with 1 day of chest pain, nausea, vomiting. Symptoms started around 2pm yesterday and have gotten gradually worse. Unable to tolerate anything PO. Denies fever, chills, hematemesis. Thinks she had a BM yesterday and it was normal. States that she did not know she has a hiatal hernia, and has never had issues with this before. In the ED patient is hypertensive, otherwise vital signs stable. WBC 9.6. K 3.3. 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 General surgery asked to see.  Abdominal surgical history: none Anticoagulants: none Nonsmoker Denies alcohol or illicit drug use Lives at home alone Ambulates without assistive device, [facility]   Family History  Problem Relation Age of Onset   Cancer Mother    Congestive Heart Failure Father     Past Medical History:  Diagnosis Date   High cholesterol    History of bone density study    History of mammogram 2021   Hypertension    Hypothyroidism    left Toe pain 12/11/2020   Per patient    Past Surgical History:  Procedure Laterality Date   OTHER SURGICAL HISTORY     Cataracts Surgery by Dr.Hecker    Social History:  reports that she has never smoked. She has never used smokeless tobacco. She reports that she does not drink alcohol and does not use drugs.  Allergies:  Allergies  Allergen Reactions   Penicillins Rash and Shortness Of Breath   Terbinafine And Related     Headache, blurred vision, dizziness   Tetanus Toxoid Adsorbed Rash    (Not in a hospital  admission)   Prior to Admission medications   Medication Sig Start Date End Date Taking? Authorizing Provider  acetaminophen (TYLENOL) 500 MG tablet Take by mouth daily as needed. 1-2 tablets    [provider]  aspirin 81 MG chewable tablet Chew 81 mg by mouth daily.    [provider]  Calcium Carbonate-Vit D-Min (CALCIUM 1200 PO) Take 1 tablet by mouth daily.    [provider]  gabapentin (NEURONTIN) 100 MG capsule Take 1 capsule by mouth at bedtime 09/21/21   Fargo, Amy E, NP  levothyroxine (EUTHYROX) 75 MCG tablet Take 1 tablet (75 mcg total) by mouth daily. 12/23/20   Yvonna Alanis, NP  lisinopril-hydrochlorothiazide (ZESTORETIC) 10-12.5 MG tablet Take 1 tablet by mouth once daily 09/14/21   Fargo, Amy E, NP  Multiple Vitamins-Minerals (MULTIVITAMIN GUMMIES ADULT PO) Take 1 capsule by mouth daily in the afternoon.    [provider]  PFIZER COVID-19 VAC BIVALENT injection  12/31/20   [provider]  simvastatin (ZOCOR) 20 MG tablet TAKE 1 TABLET BY MOUTH ONCE DAILY AT 6 PM 05/09/21   Fargo, Amy E, NP    Blood pressure (!) 163/96, pulse 88, resp. rate 15, height '5\' 1"'$  (1.549 m), weight 63 kg, SpO2 98 %. Physical Exam: General: pleasant, WD/WN female who is laying in bed in NAD HEENT: head is normocephalic, atraumatic.  Sclera are noninjected.  Pupils equal and round.  Ears and nose without any masses or lesions.  Mouth is pink and moist. Heart: regular, rate, and rhythm.  Normal s1,s2. No obvious murmurs, gallops, or rubs noted.  Palpable radial and pedal pulses bilaterally  Lungs: CTAB, no wheezes, rhonchi, or rales noted.  Respiratory effort nonlabored Abd: soft, NT, no masses, hernias, or organomegaly MS: no BUE/BLE edema, calves soft and nontender Skin: warm and dry with no masses, lesions, or rashes Psych: A&Ox4 with an appropriate affect Neuro: MAEs, no gross motor or sensory deficits BUE/BLE  Results for orders placed or performed  during the hospital encounter of 09/22/21 (from the past 48 hour(s))  CBC with Differential/Platelet     Status: Abnormal   Collection Time: 09/22/21  8:37 AM  Result Value Ref Range   WBC 9.6 4.0 - 10.5 K/uL   RBC 5.16 (H) 3.87 - 5.11 MIL/uL   Hemoglobin 16.6 (H) 12.0 - 15.0 g/dL   HCT 47.9 (H) 36.0 - 46.0 %   MCV 92.8 80.0 - 100.0 fL   MCH 32.2 26.0 - 34.0 pg   MCHC 34.7 30.0 - 36.0 g/dL   RDW 13.5 11.5 - 15.5 %   Platelets 262 150 - 400 K/uL   nRBC 0.0 0.0 - 0.2 %   Neutrophils Relative % 79 %   Neutro Abs 7.5 1.7 - 7.7 K/uL   Lymphocytes Relative 14 %   Lymphs Abs 1.4 0.7 - 4.0 K/uL   Monocytes Relative 7 %   Monocytes Absolute 0.7 0.1 - 1.0 K/uL   Eosinophils Relative 0 %   Eosinophils Absolute 0.0 0.0 - 0.5 K/uL   Basophils Relative 0 %   Basophils Absolute 0.0 0.0 - 0.1 K/uL   Immature Granulocytes 0 %   Abs Immature Granulocytes 0.02 0.00 - 0.07 K/uL    Comment: Performed at [facility], [place] [address], [place], [place] [address]  Comprehensive metabolic panel     Status: Abnormal   Collection Time: 09/22/21  9:19 AM  Result Value Ref Range   Sodium 143 135 - 145 mmol/L   Potassium 3.3 (L) 3.5 - 5.1 mmol/L   Chloride 103 98 - 111 mmol/L   CO2 29 22 - 32 mmol/L   Glucose, Bld 131 (H) 70 - 99 mg/dL    Comment: Glucose reference range applies only to samples taken after fasting for at least 8 hours.   BUN 29 (H) 8 - 23 mg/dL   Creatinine, Ser 0.83 0.44 - 1.00 mg/dL   Calcium 8.9 8.9 - 10.3 mg/dL   Total Protein 7.2 6.5 - 8.1 g/dL   Albumin 4.1 3.5 - 5.0 g/dL   AST 31 15 - 41 U/L   ALT 22 0 - 44 U/L   Alkaline Phosphatase 66 38 - 126 U/L   Total Bilirubin 0.7 0.3 - 1.2 mg/dL   GFR, Estimated >60 >60 mL/min    Comment: (NOTE) Calculated using the CKD-EPI Creatinine Equation (2021)    Anion gap 11 5 - 15    Comment: Performed at [facility], [place] [address], [place], [place] [address]  Lipase, blood     Status: None    Collection Time: 09/22/21  9:19 AM  Result Value Ref Range   Lipase 40 11 - 51 U/L    Comment: Performed at [facility], [place] [address], [place], [place] [address]  Troponin I (High Sensitivity)     Status: None   Collection Time: 09/22/21  9:19 AM  Result Value Ref Range   Troponin I (High Sensitivity)  8 <18 ng/L    Comment: (NOTE) Elevated high sensitivity troponin I (hsTnI) values and significant  changes across serial measurements may suggest ACS but many other  chronic and acute conditions are known to elevate hsTnI results.  Refer to the "Links" section for chest pain algorithms and additional  guidance. Performed at [facility], [place] [address], [place], [place] [address]   Troponin I (High Sensitivity)     Status: None   Collection Time: 09/22/21 10:29 AM  Result Value Ref Range   Troponin I (High Sensitivity) 9 <18 ng/L    Comment: (NOTE) Elevated high sensitivity troponin I (hsTnI) values and significant  changes across serial measurements may suggest ACS but many other  chronic and acute conditions are known to elevate hsTnI results.  Refer to the "Links" section for chest pain algorithms and additional  guidance. Performed at [facility], [place] [address], [place], [place] [address]    CT Angio Chest/Abd/Pel for Dissection W and/or W/WO  Result Date: 09/22/2021 CLINICAL DATA:  Chest pain, nausea, vomiting, clinical suspicion of acute aortic syndrome EXAM: CT ANGIOGRAPHY CHEST, ABDOMEN AND PELVIS TECHNIQUE: Non-contrast CT of the chest was initially obtained. Multidetector CT imaging through the chest, abdomen and pelvis was performed using the standard protocol during bolus administration of intravenous contrast. Multiplanar reconstructed images and MIPs were obtained and reviewed to evaluate the vascular anatomy. RADIATION DOSE REDUCTION: This exam was performed according to the departmental dose-optimization  program which includes automated exposure control, adjustment of the mA and/or kV according to patient size and/or use of iterative reconstruction technique. CONTRAST:  182m OMNIPAQUE IOHEXOL 350 MG/ML SOLN COMPARISON:  Chest radiograph done on 09/22/2021, CT chest done on 03/03/2005 FINDINGS: CTA CHEST FINDINGS Cardiovascular: There is no demonstrable intramural hematoma in the thoracic aorta in the noncontrast images of chest. There is homogeneous enhancement in thoracic aorta. There is no demonstrable intimal flap. Major branches of the thoracic aorta appear patent. There are no intraluminal filling defects in pulmonary artery branches. Coronary artery calcifications are seen. Mediastinum/Nodes: No significant lymphadenopathy is seen. Thyroid appears to be atrophic. Lungs/Pleura: There is no focal pulmonary consolidation. Subtle increased interstitial markings are seen in the lateral aspects of both upper lung fields, more so on the right side. Subtle ground-glass densities are noted in lingula and left lower lobe. Musculoskeletal: Dextroscoliosis is seen in thoracic spine. There is a rotoscoliosis with convexity to the left at the thoracolumbar junction. Degenerative changes are noted in the left hip. Review of the MIP images confirms the above findings. CTA ABDOMEN AND PELVIS FINDINGS VASCULAR Aorta: Scattered calcifications are seen in the aorta without evidence of dissection or focal aneurysmal dilation. Celiac: There are coarse calcifications at the origin of celiac axis. Major branches of celiac axis appear patent. SMA: No significant stenosis. Renals: Unremarkable. IMA: Patent. Inflow: Unremarkable. Veins: Unremarkable. Review of the MIP images confirms the above findings. NON-VASCULAR Hepatobiliary: There is fatty infiltration of the liver. There is no dilation of bile ducts. Gallbladder is unremarkable. Pancreas: There is prominence of pancreatic duct. No focal abnormalities are seen. Spleen:  Unremarkable. Adrenals/Urinary Tract: Adrenals are unremarkable. There is no hydronephrosis. There are no renal or ureteral stones. Urinary bladder is not distended. Stomach/Bowel: There is a large fixed hiatal hernia. There is marked distention of the intrathoracic portion of the stomach with fluid in the lumen. Greater curvature aspect of stomach appears cephalad. Findings suggest possible organo-axial volvulus of the stomach with obstruction. Small bowel loops  are not dilated. Appendix is not distinctly seen. Diverticula are seen in the colon without signs of focal acute diverticulitis. Lymphatic: No significant lymphadenopathy seen. Reproductive: Unremarkable. Other: There is no ascites or pneumoperitoneum. Musculoskeletal: There is roto scoliosis at thoracolumbar junction with convexity to the left. There are few smooth marginated fluid density structures in presacral region, possibly perineural cysts. Review of the MIP images confirms the above findings. IMPRESSION: There is no evidence of dissection in the thoracic and abdominal aorta. Major branches of thoracic and abdominal aorta are patent. There is no evidence of pulmonary artery embolism. Scattered coronary artery calcifications are seen. Subtle increase in interstitial markings in the periphery of both upper lung fields may suggest scarring. Faint ground-glass densities in the lingula and left lower lobe may suggest scarring or interstitial pneumonia. There is a large fixed hiatal hernia. There is marked distention of the stomach within the hernia along with malrotation suggesting possible organo-axial volvulus of the stomach. Surgical consultation should be considered. There is no evidence of small-bowel obstruction. There is no hydronephrosis. Fatty liver. Diverticulosis of colon. Possible perineural cysts are seen in the presacral region and pelvis. Imaging finding of gastric volvulus was relayed to Dr. Zenia Resides by telephone call. Electronically Signed    By: Elmer Picker M.D.   On: 09/22/2021 11:37   DG Chest Port 1 View  Result Date: 09/22/2021 CLINICAL DATA:  Nausea and vomiting for 1 day, new mid chest pain this morning EXAM: PORTABLE CHEST 1 VIEW COMPARISON:  Portable exam 0837 hours without priors for comparison FINDINGS: Upper normal size of cardiac silhouette. Large hiatal hernia. Pulmonary vascularity normal. Atherosclerotic calcification aorta. Lungs clear. No infiltrate, pleural effusion, or pneumothorax. Osseous demineralization. IMPRESSION: Large hiatal hernia. No acute abnormalities. Aortic Atherosclerosis (ICD10-I70.0). Electronically Signed   By: Lavonia Dana M.D.   On: 09/22/2021 08:43      Assessment/Plan Gastric volvulus with obstruction - Patient presents with 1 day of abdominal pain, nausea, vomiting. CTA reveals a large fixed hiatal hernia with marked distention of the stomach within the hernia along with malrotation suggesting possible organo-axial volvulus of the stomach, no evidence of pneumatosis or free air. Patient is not tachycardic, hypotensive, or febrile. Her WBC is WNL. No current evidence for gastric ischemia. No indication for acute surgical intervention. Recommend NG tube for decompression. Likely will need this fixed to prevent recurrence, but I do not think it has to happen immediately if we can get her decompressed with an NG tube. Will try to coordinate with our foregut surgeons.   ID - none VTE - SCDs FEN - IVF, NPO/NGT to LIWS Foley - none  Hypokalemia - replete in IVF HTN - PRN IV meds while NPO HLD Hypothyroidism  I reviewed ED provider notes, last 24 h vitals and pain scores, last 48 h intake and output, last 24 h labs and trends, and last 24 h imaging results   Wellington Hampshire, [facility] 09/22/2021, 12:24 PM Please see Amion for pager number during day hours 7:00am-4:30pm

## 2021-09-22 NOTE — ED Notes (Signed)
IV compazine given due to pt still vomiting at this time.

## 2021-09-22 NOTE — Progress Notes (Addendum)
Asked to help evaluate this patient given my expertise in foregut surgery.  Patient with known scoliosis and mild chronic health issues followed regularly.  Physically active.  Sudden worsening chest pain.  Negative for MI or PE but most of her stomach is flipped in mesentero- axial volvulus causing mid gastric obstruction.  Agree with nasogastric tube decompression and rehydration.  Given the large volume of this with obstruction, suspect she would need surgical reduction this hospitalization.  I have tentatively blocked out sometime tomorrow afternoon to see if this can be done by me pending on availability.  Ideally would try a minimal invasive robotic approach to reduce, help close hiatal hernia, partial fundoplication.  Possible gastrostomy tube if there is evidence of significant debilitation or decompensation but that seems less likely since she is otherwise active..  There is no evidence of fever, pneumatosis/perforation, shock, multisystem organ failure that would imply necrosis requiring emergent intervention.  We will be available help to talk with the patient further in the morning.  Adin Hector, MD, FACS, MASCRS Esophageal, Gastrointestinal & Colorectal Surgery Robotic and Minimally Invasive Surgery  [facility] [address] [place], [place] [address] [phone] Fax [phone] [address]  CONTACT INFORMATION:  Weekday (9AM-5PM): Call [facility] main office at [phone]  Weeknight (5PM-9AM) or Weekend/Holiday: Check [email or web address] (password " [email or web address]") for General Surgery [facility] coverage  (Please, do not use SecureChat as it is not reliable communication to operating surgeons for immediate patient care)

## 2021-09-23 ENCOUNTER — Encounter (HOSPITAL_COMMUNITY): Admission: EM | Disposition: A | Discharge status: Home Health | Payer: Self-pay | Source: Home / Self Care

## 2021-09-23 ENCOUNTER — Inpatient Hospital Stay (HOSPITAL_COMMUNITY): Payer: PPO | Admitting: Certified Registered Nurse Anesthetist

## 2021-09-23 ENCOUNTER — Other Ambulatory Visit: Payer: Self-pay

## 2021-09-23 ENCOUNTER — Encounter (HOSPITAL_COMMUNITY): Payer: Self-pay

## 2021-09-23 DIAGNOSIS — K440 Diaphragmatic hernia with obstruction, without gangrene: Secondary | ICD-10-CM

## 2021-09-23 DIAGNOSIS — I10 Essential (primary) hypertension: Secondary | ICD-10-CM

## 2021-09-23 DIAGNOSIS — K311 Adult hypertrophic pyloric stenosis: Secondary | ICD-10-CM

## 2021-09-23 DIAGNOSIS — K3189 Other diseases of stomach and duodenum: Secondary | ICD-10-CM

## 2021-09-23 HISTORY — PX: XI ROBOTIC ASSISTED HIATAL HERNIA REPAIR: SHX6889

## 2021-09-23 LAB — BASIC METABOLIC PANEL
Anion gap: 9 (ref 5–15)
BUN: 28 mg/dL — ABNORMAL HIGH (ref 8–23)
CO2: 30 mmol/L (ref 22–32)
Calcium: 8.7 mg/dL — ABNORMAL LOW (ref 8.9–10.3)
Chloride: 103 mmol/L (ref 98–111)
Creatinine, Ser: 0.82 mg/dL (ref 0.44–1.00)
GFR, Estimated: >60 mL/min (ref >60)
Glucose, Bld: 118 mg/dL — ABNORMAL HIGH (ref 70–99)
Potassium: 3.7 mmol/L (ref 3.5–5.1)
Sodium: 142 mmol/L (ref 135–145)

## 2021-09-23 LAB — CBC
HCT: 42.4 % (ref 36.0–46.0)
Hemoglobin: 14.1 g/dL (ref 12.0–15.0)
MCH: 31.5 pg (ref 26.0–34.0)
MCHC: 33.3 g/dL (ref 30.0–36.0)
MCV: 94.9 fL (ref 80.0–100.0)
Platelets: 211 10*3/uL (ref 150–400)
RBC: 4.47 MIL/uL (ref 3.87–5.11)
RDW: 14.1 % (ref 11.5–15.5)
WBC: 13.7 10*3/uL — ABNORMAL HIGH (ref 4.0–10.5)
nRBC: 0 % (ref 0.0–0.2)

## 2021-09-23 LAB — MAGNESIUM: Magnesium: 2.2 mg/dL (ref 1.7–2.4)

## 2021-09-23 SURGERY — REPAIR, HERNIA, HIATAL, ROBOT-ASSISTED
Anesthesia: General

## 2021-09-23 MED ORDER — LORAZEPAM 2 MG/ML IJ SOLN: 0.5000 mg | Freq: Three times a day (TID) | INTRAMUSCULAR | Status: DC | PRN

## 2021-09-23 MED ORDER — LACTATED RINGERS IV BOLUS: 1000.0000 mL | Freq: Three times a day (TID) | INTRAVENOUS | Status: DC | PRN

## 2021-09-23 MED ORDER — FENTANYL CITRATE (PF) 100 MCG/2ML IJ SOLN
INTRAMUSCULAR | Status: DC | PRN
  Administered 2021-09-23: 50 ug via INTRAVENOUS
  Administered 2021-09-23: 100 ug via INTRAVENOUS
  Administered 2021-09-23 (×2): 50 ug via INTRAVENOUS

## 2021-09-23 MED ORDER — ROCURONIUM BROMIDE 10 MG/ML (PF) SYRINGE
PREFILLED_SYRINGE | INTRAVENOUS | Status: AC
  Filled 2021-09-23: qty 10

## 2021-09-23 MED ORDER — BUPIVACAINE LIPOSOME 1.3 % IJ SUSP
INTRAMUSCULAR | Status: AC
  Filled 2021-09-23: qty 20

## 2021-09-23 MED ORDER — BUPIVACAINE-EPINEPHRINE (PF) 0.25% -1:200000 IJ SOLN
INTRAMUSCULAR | Status: AC
  Filled 2021-09-23: qty 60

## 2021-09-23 MED ORDER — LACTATED RINGERS IV SOLN
INTRAVENOUS | Status: AC | PRN
  Administered 2021-09-23: 1000 mL

## 2021-09-23 MED ORDER — DEXAMETHASONE SODIUM PHOSPHATE 10 MG/ML IJ SOLN
INTRAMUSCULAR | Status: DC | PRN
  Administered 2021-09-23: 8 mg via INTRAVENOUS

## 2021-09-23 MED ORDER — LIDOCAINE HCL (PF) 2 % IJ SOLN
INTRAMUSCULAR | Status: DC | PRN
  Administered 2021-09-23: 1.5 mg/kg/h via INTRADERMAL

## 2021-09-23 MED ORDER — LACTATED RINGERS IV SOLN: INTRAVENOUS | Status: DC | PRN

## 2021-09-23 MED ORDER — LIDOCAINE HCL (CARDIAC) PF 100 MG/5ML IV SOSY
PREFILLED_SYRINGE | INTRAVENOUS | Status: DC | PRN
  Administered 2021-09-23: 100 mg via INTRAVENOUS

## 2021-09-23 MED ORDER — PROPOFOL 10 MG/ML IV BOLUS
INTRAVENOUS | Status: DC | PRN
  Administered 2021-09-23: 120 mg via INTRAVENOUS

## 2021-09-23 MED ORDER — FENTANYL CITRATE (PF) 250 MCG/5ML IJ SOLN
INTRAMUSCULAR | Status: AC
  Filled 2021-09-23: qty 5

## 2021-09-23 MED ORDER — PROPOFOL 10 MG/ML IV BOLUS
INTRAVENOUS | Status: AC
  Filled 2021-09-23: qty 20

## 2021-09-23 MED ORDER — PHENYLEPHRINE HCL (PRESSORS) 10 MG/ML IV SOLN
INTRAVENOUS | Status: AC
  Filled 2021-09-23: qty 1

## 2021-09-23 MED ORDER — FENTANYL CITRATE PF 50 MCG/ML IJ SOSY
PREFILLED_SYRINGE | INTRAMUSCULAR | Status: AC
  Filled 2021-09-23: qty 2

## 2021-09-23 MED ORDER — 0.9 % SODIUM CHLORIDE (POUR BTL) OPTIME
TOPICAL | Status: DC | PRN
  Administered 2021-09-23: 1000 mL

## 2021-09-23 MED ORDER — CHLORHEXIDINE GLUCONATE 0.12 % MT SOLN
15.0000 mL | Freq: Once | OROMUCOSAL | Status: AC
  Administered 2021-09-23: 15 mL via OROMUCOSAL

## 2021-09-23 MED ORDER — SIMETHICONE 80 MG PO CHEW
80.0000 mg | CHEWABLE_TABLET | Freq: Four times a day (QID) | ORAL | Status: DC
  Administered 2021-09-23 – 2021-09-26 (×10): 80 mg via ORAL
  Filled 2021-09-23 (×10): qty 1

## 2021-09-23 MED ORDER — SUCCINYLCHOLINE CHLORIDE 200 MG/10ML IV SOSY
PREFILLED_SYRINGE | INTRAVENOUS | Status: DC | PRN
  Administered 2021-09-23: 120 mg via INTRAVENOUS

## 2021-09-23 MED ORDER — SUGAMMADEX SODIUM 500 MG/5ML IV SOLN
INTRAVENOUS | Status: DC | PRN
  Administered 2021-09-23: 252 mg via INTRAVENOUS

## 2021-09-23 MED ORDER — LACTATED RINGERS IV BOLUS
1000.0000 mL | Freq: Once | INTRAVENOUS | Status: AC
  Administered 2021-09-23: 1000 mL via INTRAVENOUS

## 2021-09-23 MED ORDER — ACETAMINOPHEN 10 MG/ML IV SOLN
INTRAVENOUS | Status: DC | PRN
  Administered 2021-09-23: 1000 mg via INTRAVENOUS

## 2021-09-23 MED ORDER — EPHEDRINE SULFATE (PRESSORS) 50 MG/ML IJ SOLN
INTRAMUSCULAR | Status: DC | PRN
  Administered 2021-09-23: 5 mg via INTRAVENOUS

## 2021-09-23 MED ORDER — BUPIVACAINE LIPOSOME 1.3 % IJ SUSP
INTRAMUSCULAR | Status: DC | PRN
  Administered 2021-09-23: 20 mL

## 2021-09-23 MED ORDER — BUPIVACAINE-EPINEPHRINE 0.25% -1:200000 IJ SOLN
INTRAMUSCULAR | Status: DC | PRN
  Administered 2021-09-23: 30 mL

## 2021-09-23 MED ORDER — ACETAMINOPHEN 650 MG RE SUPP: 650.0000 mg | Freq: Four times a day (QID) | RECTAL | Status: DC | PRN

## 2021-09-23 MED ORDER — FENTANYL CITRATE PF 50 MCG/ML IJ SOSY
25.0000 ug | PREFILLED_SYRINGE | INTRAMUSCULAR | Status: DC | PRN
  Administered 2021-09-23: 50 ug via INTRAVENOUS

## 2021-09-23 MED ORDER — PHENYLEPHRINE HCL-NACL 20-0.9 MG/250ML-% IV SOLN
INTRAVENOUS | Status: DC | PRN
  Administered 2021-09-23: 50 ug/min via INTRAVENOUS

## 2021-09-23 MED ORDER — ROCURONIUM BROMIDE 100 MG/10ML IV SOLN
INTRAVENOUS | Status: DC | PRN
  Administered 2021-09-23: 60 mg via INTRAVENOUS
  Administered 2021-09-23: 40 mg via INTRAVENOUS
  Administered 2021-09-23: 30 mg via INTRAVENOUS

## 2021-09-23 MED ORDER — PHENYLEPHRINE HCL (PRESSORS) 10 MG/ML IV SOLN
INTRAVENOUS | Status: DC | PRN
  Administered 2021-09-23 (×5): 160 ug via INTRAVENOUS
  Administered 2021-09-23: 80 ug via INTRAVENOUS
  Administered 2021-09-23: 160 ug via INTRAVENOUS

## 2021-09-23 SURGICAL SUPPLY — 70 items
APL PRP STRL LF DISP 70% ISPRP (MISCELLANEOUS) ×1
APL SWBSTK 6 STRL LF DISP (MISCELLANEOUS) ×1
APPLICATOR COTTON TIP 6 STRL (MISCELLANEOUS) ×1 IMPLANT
APPLICATOR COTTON TIP 6IN STRL (MISCELLANEOUS) ×2 IMPLANT
APPLIER CLIP 5 13 M/L LIGAMAX5 (MISCELLANEOUS)
APR CLP MED LRG 5 ANG JAW (MISCELLANEOUS)
BAG COUNTER SPONGE SURGICOUNT (BAG) ×2 IMPLANT
BAG SPNG CNTER NS LX DISP (BAG) ×1
BLADE SURG SZ11 CARB STEEL (BLADE) ×2 IMPLANT
CHLORAPREP W/TINT 26 (MISCELLANEOUS) ×2 IMPLANT
CLIP APPLIE 5 13 M/L LIGAMAX5 (MISCELLANEOUS) IMPLANT
COVER SURGICAL LIGHT HANDLE (MISCELLANEOUS) ×2 IMPLANT
COVER TIP SHEARS 8 DVNC (MISCELLANEOUS) IMPLANT
COVER TIP SHEARS 8MM DA VINCI (MISCELLANEOUS)
DRAIN CHANNEL 19F RND (DRAIN) ×1 IMPLANT
DRAIN PENROSE 0.5X18 (DRAIN) IMPLANT
DRAPE ARM DVNC X/XI (DISPOSABLE) ×4 IMPLANT
DRAPE COLUMN DVNC XI (DISPOSABLE) ×1 IMPLANT
DRAPE DA VINCI XI ARM (DISPOSABLE) ×8
DRAPE DA VINCI XI COLUMN (DISPOSABLE) ×2
DRAPE WARM FLUID 44X44 (DRAPES) ×2 IMPLANT
DRSG TEGADERM 2-3/8X2-3/4 SM (GAUZE/BANDAGES/DRESSINGS) ×6 IMPLANT
DRSG TEGADERM 4X4.75 (GAUZE/BANDAGES/DRESSINGS) ×1 IMPLANT
ELECT REM PT RETURN 15FT ADLT (MISCELLANEOUS) ×2 IMPLANT
ENDOLOOP SUT PDS II  0 18 (SUTURE)
ENDOLOOP SUT PDS II 0 18 (SUTURE) IMPLANT
EVACUATOR DRAINAGE 10X20 100CC (DRAIN) IMPLANT
EVACUATOR SILICONE 100CC (DRAIN) ×1 IMPLANT
FELT TEFLON 4 X1 (Mesh General) ×2 IMPLANT
GAUZE SPONGE 2X2 8PLY STRL LF (GAUZE/BANDAGES/DRESSINGS) ×1 IMPLANT
GLOVE ECLIPSE 8.0 STRL XLNG CF (GLOVE) ×4 IMPLANT
GLOVE INDICATOR 8.0 STRL GRN (GLOVE) ×4 IMPLANT
GOWN STRL REUS W/ TWL XL LVL3 (GOWN DISPOSABLE) ×4 IMPLANT
GOWN STRL REUS W/TWL XL LVL3 (GOWN DISPOSABLE) ×8
GRASPER SUT TROCAR 14GX15 (MISCELLANEOUS) IMPLANT
IRRIG SUCT STRYKERFLOW 2 WTIP (MISCELLANEOUS) ×2
IRRIGATION SUCT STRKRFLW 2 WTP (MISCELLANEOUS) ×1 IMPLANT
KIT BASIN OR (CUSTOM PROCEDURE TRAY) ×2 IMPLANT
KIT TURNOVER KIT A (KITS) IMPLANT
MESH PHASIX RESORB RECT 10X15 (Mesh General) ×1 IMPLANT
NDL INSUFFLATION 14GA 150MM (NEEDLE) IMPLANT
NEEDLE HYPO 22GX1.5 SAFETY (NEEDLE) ×2 IMPLANT
NEEDLE INSUFFLATION 14GA 150MM (NEEDLE) ×2 IMPLANT
PACK CARDIOVASCULAR III (CUSTOM PROCEDURE TRAY) ×2 IMPLANT
PAD POSITIONING PINK XL (MISCELLANEOUS) ×2 IMPLANT
PENCIL SMOKE EVACUATOR (MISCELLANEOUS) IMPLANT
SCISSORS LAP 5X45 EPIX DISP (ENDOMECHANICALS) ×1 IMPLANT
SEAL CANN UNIV 5-8 DVNC XI (MISCELLANEOUS) ×4 IMPLANT
SEAL XI 5MM-8MM UNIVERSAL (MISCELLANEOUS) ×8
SEALER VESSEL DA VINCI XI (MISCELLANEOUS) ×2
SEALER VESSEL EXT DVNC XI (MISCELLANEOUS) ×1 IMPLANT
SOLUTION ELECTROLUBE (MISCELLANEOUS) ×2 IMPLANT
SPIKE FLUID TRANSFER (MISCELLANEOUS) ×2 IMPLANT
SPONGE GAUZE 2X2 STER 10/PKG (GAUZE/BANDAGES/DRESSINGS) ×1
STOPCOCK 4 WAY LG BORE MALE ST (IV SETS) ×4 IMPLANT
SUT ETHIBOND 0 36 GRN (SUTURE) ×4 IMPLANT
SUT ETHIBOND NAB CT1 #1 30IN (SUTURE) ×6 IMPLANT
SUT MNCRL AB 4-0 PS2 18 (SUTURE) ×2 IMPLANT
SUT PROLENE 2 0 SH DA (SUTURE) ×1 IMPLANT
SUT VICRYL 0 TIES 12 18 (SUTURE) IMPLANT
SUT VICRYL 0 UR6 27IN ABS (SUTURE) IMPLANT
SUT VLOC 180 2-0 9IN GS21 (SUTURE) ×2 IMPLANT
SYR 10ML LL (SYRINGE) ×2 IMPLANT
SYR 20ML LL LF (SYRINGE) ×2 IMPLANT
TOWEL OR 17X26 10 PK STRL BLUE (TOWEL DISPOSABLE) ×2 IMPLANT
TOWEL OR NON WOVEN STRL DISP B (DISPOSABLE) ×2 IMPLANT
TRAY FOLEY MTR SLVR 14FR STAT (SET/KITS/TRAYS/PACK) IMPLANT
TRAY FOLEY MTR SLVR 16FR STAT (SET/KITS/TRAYS/PACK) IMPLANT
TROCAR ADV FIXATION 5X100MM (TROCAR) ×2 IMPLANT
TUBING INSUFFLATION 10FT LAP (TUBING) ×2 IMPLANT

## 2021-09-23 NOTE — Progress Notes (Signed)
   09/23/21 1450  Mobility  Activity Off unit   Cancellation Note: No mobility at this time, pt in surgery. Will check back as schedule permits.   [facility] Acute Rehabilitation Services Phone: [phone] 09/23/21, 2:51 PM

## 2021-09-23 NOTE — Progress Notes (Addendum)
Terri Mcgee [id number] 06-12-1945  CARE TEAM:  PCP: Yvonna Alanis, NP  Outpatient Care Team: Patient Care Team: Yvonna Alanis, NP as PCP - General (Adult Health Nurse Practitioner) [name], MD as Consulting Physician (Ophthalmology)  Inpatient Treatment Team: Treatment Team: Attending Provider: Nolon Nations, MD; Consulting Physician: Nolon Nations, MD; Consulting Physician: Michael Boston, MD; Technician: Abbe Amsterdam, NT; Registered Nurse: Roselind Rily, RN; Utilization Review: Beau Fanny, RN; Registered Nurse: Dorinda Hill, RN; Pharmacist: Adrian Saran, [facility]   Problem List:   Principal Problem:   Gastric volvulus Active Problems:   Allergic rhinitis   Hyperlipidemia   Hypothyroidism   Essential hypertension       Assessment  Incarcerated hiatal hernia with mesentery-axial volvulus station obstruction.  [facility] Stay = 1 days)  Plan:  Continue nasogastric tube decompression.  Nausea and pain control.  Minimally invasive reduction repair of hiatal hernia.  Probable fundoplication.  In this situation where preoperative manometry is not an option, will attempt to do partial toupet posterior fundoplication.  Possible need for gastrostomy tube although she is not in shock and there is no pneumatosis and she has good swallowing and function activity.  The anatomy & physiology of the foregut and anti-reflux mechanism was discussed.  The pathophysiology of hiatal herniation and GERD was discussed.  Natural history risks without surgery was discussed.   The patient's symptoms are not adequately controlled by medicines and other non-operative treatments.  I feel the risks of no intervention will lead to serious problems that outweigh the operative risks; therefore, I recommended surgery to reduce the hiatal hernia out of the chest and fundoplication to rebuild the anti-reflux valve and control reflux better.  Need for a thorough workup to rule out the  differential diagnosis and plan treatment was explained.  I explained minimally invasive techniques with possible need for an open approach.  Risks such as bleeding, infection, abscess, leak,injury to other organs, need for repair of tissues / organs, need for further treatment, stroke, heart attack, death, and other risks were discussed.   I noted a good likelihood this will help address the problem.  Goals of post-operative recovery were discussed as well.  Possibility that this will not correct all symptoms was explained.  Post-operative dysphagia, need for short-term liquid & pureed diet, inability to vomit, possibility of reherniation, possible need for medicines to help control symptoms in addition to surgery were discussed.  We will work to minimize complications.   Educational handouts further explaining the pathology, treatment options, and dysphagia diet was given as well.  Questions were answered.  The patient expresses understanding & wishes to proceed with surgery.  -Hypertension control.  IV medications for now. -Chronic hypothyroidism.  Switching to IV levothyroxine for now -VTE prophylaxis- SCDs, etc -mobilize as tolerated to help recovery  Disposition: TBD.      I reviewed nursing notes, ED provider notes, last 24 h vitals and pain scores, last 48 h intake and output, last 24 h labs and trends, and last 24 h imaging results. I have reviewed this patient's available data, including medical history, events of note, test results, etc as part of my evaluation.  A significant portion of that time was spent in counseling.  Care during the described time interval was provided by me.  This care required moderate level of medical decision making.  09/23/2021    Subjective: (Chief complaint)  Nasogastric tube in place.  Somewhat irritating but tolerating it.  Less chest  pain.  Nervous about surgery but wishes to proceed.  Objective:  Vital signs:  Vitals:   09/22/21 1645  09/22/21 2142 09/23/21 0103 09/23/21 0538  BP: (!) 146/65 (!) 150/71 (!) 157/79 122/67  Pulse: 92 93 94 85  Resp: '16 16 15 16  '$ Temp: 98.9 F (37.2 C) 98.9 F (37.2 C) 98.6 F (37 C) 98.9 F (37.2 C)  TempSrc: Oral Oral Oral Oral  SpO2: 97% 97% 96% 95%  Weight:      Height:           Intake/Output   Yesterday:  07/13 0701 - 07/14 0700 In: 3360.2 [I.V.:1779.2; IV Piggyback:1581] Out: 450 [Emesis/NG output:450] This shift:  No intake/output data recorded.  Bowel function:  Flatus: No  BM:  No  Drain: Nasogastric tube with thick bilious output   Physical Exam:  General: Pt awake/alert in no acute distress Eyes: PERRL, normal EOM.  Sclera clear.  No icterus Neuro: CN II-XII intact w/o focal sensory/motor deficits. Lymph: No head/neck/groin lymphadenopathy Psych:  No delerium/psychosis/paranoia.  Oriented x 4 HENT: Normocephalic, Mucus membranes moist.  No thrush Neck: Supple, No tracheal deviation.  No obvious thyromegaly Chest: No pain to chest wall compression.  Good respiratory excursion.  No audible wheezing CV:  Pulses intact.  Regular rhythm.  No major extremity edema MS: Normal AROM mjr joints.  No obvious deformity  Abdomen: Soft.  Mildy distended.  Nontender.  No evidence of peritonitis.  No incarcerated hernias.  Ext:   No deformity.  No mjr edema.  No cyanosis Skin: No petechiae / purpurea.  No major sores.  Warm and dry    Results:   Cultures: Recent Results (from the past 720 hour(s))  Surgical pcr screen     Status: None   Collection Time: 09/22/21  7:32 PM   Specimen: Nasal Mucosa; Nasal Swab  Result Value Ref Range Status   MRSA, PCR NEGATIVE NEGATIVE Final   Staphylococcus aureus NEGATIVE NEGATIVE Final    Comment: (NOTE) The Xpert SA Assay (FDA approved for NASAL specimens in patients 66 years of age and older), is one component of a comprehensive surveillance program. It is not intended to diagnose infection nor to guide or monitor  treatment. Performed at [facility], [place] [address] [place], [place] [address]     Labs: Results for orders placed or performed during the hospital encounter of 09/22/21 (from the past 48 hour(s))  CBC with Differential/Platelet     Status: Abnormal   Collection Time: 09/22/21  8:37 AM  Result Value Ref Range   WBC 9.6 4.0 - 10.5 K/uL   RBC 5.16 (H) 3.87 - 5.11 MIL/uL   Hemoglobin 16.6 (H) 12.0 - 15.0 g/dL   HCT 47.9 (H) 36.0 - 46.0 %   MCV 92.8 80.0 - 100.0 fL   MCH 32.2 26.0 - 34.0 pg   MCHC 34.7 30.0 - 36.0 g/dL   RDW 13.5 11.5 - 15.5 %   Platelets 262 150 - 400 K/uL   nRBC 0.0 0.0 - 0.2 %   Neutrophils Relative % 79 %   Neutro Abs 7.5 1.7 - 7.7 K/uL   Lymphocytes Relative 14 %   Lymphs Abs 1.4 0.7 - 4.0 K/uL   Monocytes Relative 7 %   Monocytes Absolute 0.7 0.1 - 1.0 K/uL   Eosinophils Relative 0 %   Eosinophils Absolute 0.0 0.0 - 0.5 K/uL   Basophils Relative 0 %   Basophils Absolute 0.0 0.0 - 0.1 K/uL  Immature Granulocytes 0 %   Abs Immature Granulocytes 0.02 0.00 - 0.07 K/uL    Comment: Performed at [facility], [place] [address], [place], [place] [address]  Comprehensive metabolic panel     Status: Abnormal   Collection Time: 09/22/21  9:19 AM  Result Value Ref Range   Sodium 143 135 - 145 mmol/L   Potassium 3.3 (L) 3.5 - 5.1 mmol/L   Chloride 103 98 - 111 mmol/L   CO2 29 22 - 32 mmol/L   Glucose, Bld 131 (H) 70 - 99 mg/dL    Comment: Glucose reference range applies only to samples taken after fasting for at least 8 hours.   BUN 29 (H) 8 - 23 mg/dL   Creatinine, Ser 0.83 0.44 - 1.00 mg/dL   Calcium 8.9 8.9 - 10.3 mg/dL   Total Protein 7.2 6.5 - 8.1 g/dL   Albumin 4.1 3.5 - 5.0 g/dL   AST 31 15 - 41 U/L   ALT 22 0 - 44 U/L   Alkaline Phosphatase 66 38 - 126 U/L   Total Bilirubin 0.7 0.3 - 1.2 mg/dL   GFR, Estimated >60 >60 mL/min    Comment: (NOTE) Calculated using the CKD-EPI Creatinine Equation (2021)    Anion  gap 11 5 - 15    Comment: Performed at [facility], [place] [address] [place], [place] [address]  Lipase, blood     Status: None   Collection Time: 09/22/21  9:19 AM  Result Value Ref Range   Lipase 40 11 - 51 U/L    Comment: Performed at [facility], [place] [address] [place], [place] [address]  Troponin I (High Sensitivity)     Status: None   Collection Time: 09/22/21  9:19 AM  Result Value Ref Range   Troponin I (High Sensitivity) 8 <18 ng/L    Comment: (NOTE) Elevated high sensitivity troponin I (hsTnI) values and significant  changes across serial measurements may suggest ACS but many other  chronic and acute conditions are known to elevate hsTnI results.  Refer to the "Links" section for chest pain algorithms and additional  guidance. Performed at [facility], [place] [address], [place], [place] [address]   Troponin I (High Sensitivity)     Status: None   Collection Time: 09/22/21 10:29 AM  Result Value Ref Range   Troponin I (High Sensitivity) 9 <18 ng/L    Comment: (NOTE) Elevated high sensitivity troponin I (hsTnI) values and significant  changes across serial measurements may suggest ACS but many other  chronic and acute conditions are known to elevate hsTnI results.  Refer to the "Links" section for chest pain algorithms and additional  guidance. Performed at [facility], [place] [address] [place], [place] [address]   Surgical pcr screen     Status: None   Collection Time: 09/22/21  7:32 PM   Specimen: Nasal Mucosa; Nasal Swab  Result Value Ref Range   MRSA, PCR NEGATIVE NEGATIVE   Staphylococcus aureus NEGATIVE NEGATIVE    Comment: (NOTE) The Xpert SA Assay (FDA approved for NASAL specimens in patients 70 years of age and older), is one component of a comprehensive surveillance program. It is not intended to diagnose infection nor to guide or monitor treatment. Performed at [facility], [place] [address], [place], [place] [address]   CBC     Status: Abnormal   Collection Time: 09/23/21  4:55 AM  Result Value Ref Range  WBC 13.7 (H) 4.0 - 10.5 K/uL   RBC 4.47 3.87 - 5.11 MIL/uL   Hemoglobin 14.1 12.0 - 15.0 g/dL   HCT 42.4 36.0 - 46.0 %   MCV 94.9 80.0 - 100.0 fL   MCH 31.5 26.0 - 34.0 pg   MCHC 33.3 30.0 - 36.0 g/dL   RDW 14.1 11.5 - 15.5 %   Platelets 211 150 - 400 K/uL   nRBC 0.0 0.0 - 0.2 %    Comment: Performed at [facility], [place] [address] [place], [place] [address]  Basic metabolic panel     Status: Abnormal   Collection Time: 09/23/21  4:55 AM  Result Value Ref Range   Sodium 142 135 - 145 mmol/L   Potassium 3.7 3.5 - 5.1 mmol/L   Chloride 103 98 - 111 mmol/L   CO2 30 22 - 32 mmol/L   Glucose, Bld 118 (H) 70 - 99 mg/dL    Comment: Glucose reference range applies only to samples taken after fasting for at least 8 hours.   BUN 28 (H) 8 - 23 mg/dL   Creatinine, Ser 0.82 0.44 - 1.00 mg/dL   Calcium 8.7 (L) 8.9 - 10.3 mg/dL   GFR, Estimated >60 >60 mL/min    Comment: (NOTE) Calculated using the CKD-EPI Creatinine Equation (2021)    Anion gap 9 5 - 15    Comment: Performed at [facility], [place] [address] [place], [place] [address]  Magnesium     Status: None   Collection Time: 09/23/21  4:55 AM  Result Value Ref Range   Magnesium 2.2 1.7 - 2.4 mg/dL    Comment: Performed at [facility], [place] [address] [place], [place] [address]    Imaging / Studies: DG Abd Portable 1V  Result Date: 09/22/2021 CLINICAL DATA:  Enteric tube placement. EXAM: PORTABLE ABDOMEN - 1 VIEW COMPARISON:  None Available. FINDINGS: Enteric tube within the stomach below the diaphragm. Large hiatal hernia again noted. The bowel gas pattern is normal. No radio-opaque calculi or other significant radiographic abnormality are seen. Excreted contrast within both renal collecting systems. IMPRESSION: 1.  Enteric tube within the stomach below the diaphragm. 2. Large hiatal hernia. Electronically Signed   By: Titus Dubin M.D.   On: 09/22/2021 13:02   CT Angio Chest/Abd/Pel for Dissection W and/or W/WO  Result Date: 09/22/2021 CLINICAL DATA:  Chest pain, nausea, vomiting, clinical suspicion of acute aortic syndrome EXAM: CT ANGIOGRAPHY CHEST, ABDOMEN AND PELVIS TECHNIQUE: Non-contrast CT of the chest was initially obtained. Multidetector CT imaging through the chest, abdomen and pelvis was performed using the standard protocol during bolus administration of intravenous contrast. Multiplanar reconstructed images and MIPs were obtained and reviewed to evaluate the vascular anatomy. RADIATION DOSE REDUCTION: This exam was performed according to the departmental dose-optimization program which includes automated exposure control, adjustment of the mA and/or kV according to patient size and/or use of iterative reconstruction technique. CONTRAST:  154m OMNIPAQUE IOHEXOL 350 MG/ML SOLN COMPARISON:  Chest radiograph done on 09/22/2021, CT chest done on 03/03/2005 FINDINGS: CTA CHEST FINDINGS Cardiovascular: There is no demonstrable intramural hematoma in the thoracic aorta in the noncontrast images of chest. There is homogeneous enhancement in thoracic aorta. There is no demonstrable intimal flap. Major branches of the thoracic aorta appear patent. There are no intraluminal filling defects in pulmonary artery branches. Coronary artery calcifications are seen. Mediastinum/Nodes: No significant lymphadenopathy is seen. Thyroid appears to be atrophic. Lungs/Pleura: There is no focal pulmonary  consolidation. Subtle increased interstitial markings are seen in the lateral aspects of both upper lung fields, more so on the right side. Subtle ground-glass densities are noted in lingula and left lower lobe. Musculoskeletal: Dextroscoliosis is seen in thoracic spine. There is a rotoscoliosis with convexity to the left at the  thoracolumbar junction. Degenerative changes are noted in the left hip. Review of the MIP images confirms the above findings. CTA ABDOMEN AND PELVIS FINDINGS VASCULAR Aorta: Scattered calcifications are seen in the aorta without evidence of dissection or focal aneurysmal dilation. Celiac: There are coarse calcifications at the origin of celiac axis. Major branches of celiac axis appear patent. SMA: No significant stenosis. Renals: Unremarkable. IMA: Patent. Inflow: Unremarkable. Veins: Unremarkable. Review of the MIP images confirms the above findings. NON-VASCULAR Hepatobiliary: There is fatty infiltration of the liver. There is no dilation of bile ducts. Gallbladder is unremarkable. Pancreas: There is prominence of pancreatic duct. No focal abnormalities are seen. Spleen: Unremarkable. Adrenals/Urinary Tract: Adrenals are unremarkable. There is no hydronephrosis. There are no renal or ureteral stones. Urinary bladder is not distended. Stomach/Bowel: There is a large fixed hiatal hernia. There is marked distention of the intrathoracic portion of the stomach with fluid in the lumen. Greater curvature aspect of stomach appears cephalad. Findings suggest possible organo-axial volvulus of the stomach with obstruction. Small bowel loops are not dilated. Appendix is not distinctly seen. Diverticula are seen in the colon without signs of focal acute diverticulitis. Lymphatic: No significant lymphadenopathy seen. Reproductive: Unremarkable. Other: There is no ascites or pneumoperitoneum. Musculoskeletal: There is roto scoliosis at thoracolumbar junction with convexity to the left. There are few smooth marginated fluid density structures in presacral region, possibly perineural cysts. Review of the MIP images confirms the above findings. IMPRESSION: There is no evidence of dissection in the thoracic and abdominal aorta. Major branches of thoracic and abdominal aorta are patent. There is no evidence of pulmonary artery  embolism. Scattered coronary artery calcifications are seen. Subtle increase in interstitial markings in the periphery of both upper lung fields may suggest scarring. Faint ground-glass densities in the lingula and left lower lobe may suggest scarring or interstitial pneumonia. There is a large fixed hiatal hernia. There is marked distention of the stomach within the hernia along with malrotation suggesting possible organo-axial volvulus of the stomach. Surgical consultation should be considered. There is no evidence of small-bowel obstruction. There is no hydronephrosis. Fatty liver. Diverticulosis of colon. Possible perineural cysts are seen in the presacral region and pelvis. Imaging finding of gastric volvulus was relayed to Dr. Zenia Resides by telephone call. Electronically Signed   By: Elmer Picker M.D.   On: 09/22/2021 11:37   DG Chest Port 1 View  Result Date: 09/22/2021 CLINICAL DATA:  Nausea and vomiting for 1 day, new mid chest pain this morning EXAM: PORTABLE CHEST 1 VIEW COMPARISON:  Portable exam 0837 hours without priors for comparison FINDINGS: Upper normal size of cardiac silhouette. Large hiatal hernia. Pulmonary vascularity normal. Atherosclerotic calcification aorta. Lungs clear. No infiltrate, pleural effusion, or pneumothorax. Osseous demineralization. IMPRESSION: Large hiatal hernia. No acute abnormalities. Aortic Atherosclerosis (ICD10-I70.0). Electronically Signed   By: Lavonia Dana M.D.   On: 09/22/2021 08:43    Medications / Allergies: per chart  Antibiotics: Anti-infectives (From admission, onward)    Start     Dose/Rate Route Frequency Ordered Stop   09/23/21 0600  ertapenem (INVANZ) 1,000 mg in sodium chloride 0.9 % 100 mL IVPB        1 g 200  mL/hr over 30 Minutes Intravenous On call to O.R. 09/22/21 1805 09/24/21 0559         Note: Portions of this report may have been transcribed using voice recognition software. Every effort was made to ensure accuracy; however,  inadvertent computerized transcription errors may be present.   Any transcriptional errors that result from this process are unintentional.    Adin Hector, MD, FACS, MASCRS Esophageal, Gastrointestinal & Colorectal Surgery Robotic and Minimally Invasive Surgery  [facility]. [address] [place], [place] [address] [phone] Fax [phone] Main  CONTACT INFORMATION:  Weekday (9AM-5PM): Call [facility] main office at [phone]  Weeknight (5PM-9AM) or Weekend/Holiday: Check [email or web address] (password " [email or web address]") for General Surgery [facility] coverage  (Please, do not use SecureChat as it is not reliable communication to reach operating surgeons for immediate patient care)      09/23/2021  7:42 AM

## 2021-09-23 NOTE — Discharge Instructions (Signed)
EATING AFTER YOUR ESOPHAGEAL SURGERY (Stomach Fundoplication, Hiatal Hernia repair, Achalasia surgery, etc)  ######################################################################  EAT Start with a pureed / full liquid diet (see below) Gradually transition to a high fiber diet with a fiber supplement over the next month after discharge.    WALK Walk an hour a day.  Control your pain to do that.    CONTROL PAIN Control pain so that you can walk, sleep, tolerate sneezing/coughing, go up/down stairs.  HAVE A BOWEL MOVEMENT DAILY Keep your bowels regular to avoid problems.  OK to try a laxative to override constipation.  OK to use an antidairrheal to slow down diarrhea.  Call if not better after 2 tries  CALL IF YOU HAVE PROBLEMS/CONCERNS Call if you are still struggling despite following these instructions. Call if you have concerns not answered by these instructions  ######################################################################   After your esophageal surgery, expect some sticking with swallowing over the next 1-2 months.    If food sticks when you eat, it is called "dysphagia".  This is due to swelling around your esophagus at the wrap & hiatal diaphragm repair.  It will gradually ease off over the next few months.  To help you through this temporary phase, we start you out on a pureed (blenderized) diet.  Your first meal in the hospital was thin liquids.  You should have been given a pureed diet by the time you left the hospital.  We ask patients to stay on a pureed diet for the first 2-3 weeks to avoid anything getting "stuck" near your recent surgery.  Don't be alarmed if your ability to swallow doesn't progress according to this plan.  Everyone is different and some diets can advance more or less quickly.    It is often helpful to crush your medications or split them as they can sometimes stick, especially the first week or so.   Some BASIC RULES to follow  are:  Maintain an upright position whenever eating or drinking.  Take small bites - just a teaspoon size bite at a time.  Eat slowly.  It may also help to eat only one food at a time.  Consider nibbling through smaller, more frequent meals & avoid the urge to eat BIG meals  Do not push through feelings of fullness, nausea, or bloatedness  Do not mix solid foods and liquids in the same mouthful  Try not to "wash foods down" with large gulps of liquids.  Avoid carbonated (bubbly/fizzy) drinks.    Avoid foods that make you feel gassy or bloated.  Start with bland foods first.  Wait on trying greasy, fried, or spicy meals until you are tolerating more bland solids well.  Understand that it will be hard to burp and belch at first.  This gradually improves with time.  Expect to be more gassy/flatulent/bloated initially.  Walking will help your body manage it better.  Consider using medications for bloating that contain simethicone such as  Maalox or Gas-X   Consider crushing her medications, especially smaller pills.  The ability to swallow pills should get easier after a few weeks  Eat in a relaxed atmosphere & minimize distractions.  Avoid talking while eating.    Do not use straws.  Following each meal, sit in an upright position (90 degree angle) for 60 to 90 minutes.  Going for a short walk can help as well  If food does stick, don't panic.  Try to relax and let the food pass on its own.    Be gradual in changes & use common sense:  -If you easily tolerating a certain "level" of foods, advance to the next level gradually -If you are having trouble swallowing a particular food, then avoid it.   -If food is sticking when you advance your diet, go back to thinner previous diet (the lower LEVEL) for 1-2 days.  LEVEL 1 = PUREED DIET  Do for the first 2 WEEKS AFTER SURGERY  -Foods in this group are pureed or blenderized to a smooth, mashed  potato-like consistency.  -If necessary, the pureed foods can keep their shape with the addition of a thickening agent.   -Meat should be pureed to a smooth, pasty consistency.  Hot broth or gravy may be added to the pureed meat, approximately 1 oz. of liquid per 3 oz. serving of meat. -CAUTION:  If any foods do not puree into a smooth consistency, swallowing will be more difficult.  (For example, nuts or seeds sometimes do not blend well.)  Hot Foods Cold Foods  Pureed scrambled eggs and cheese Pureed cottage cheese  Baby cereals Thickened juices and nectars  Thinned cooked cereals (no lumps) Thickened milk or eggnog  Pureed [place] toast or pancakes Ensure  Mashed potatoes Ice cream  Pureed parsley, au gratin, scalloped potatoes, candied sweet potatoes Fruit or [place] ice, sherbet  Pureed buttered or alfredo noodles Plain yogurt  Pureed vegetables (no corn or peas) Instant breakfast  Pureed soups and creamed soups Smooth pudding, mousse, custard  Pureed scalloped apples Whipped gelatin  Gravies Sugar, syrup, honey, jelly  Sauces, cheese, tomato, barbecue, white, creamed Cream  Any baby food Creamer  Alcohol in moderation (not beer or champagne) Margarine  Coffee or tea Mayonnaise   Ketchup, mustard   Apple sauce   SAMPLE MENU:  PUREED DIET Breakfast Lunch Dinner  Orange juice, 1/2 cup Cream of wheat, 1/2 cup Pineapple juice, 1/2 cup Pureed [place], barley soup, 3/4 cup Pureed Hawaiian chicken, 3 oz  Scrambled eggs, mashed or blended with cheese, 1/2 cup Tea or coffee, 1 cup  Whole milk, 1 cup  Non-dairy creamer, 2 Tbsp. Mashed potatoes, 1/2 cup Pureed cooled broccoli, 1/2 cup Apple sauce, 1/2 cup Coffee or tea Mashed potatoes, 1/2 cup Pureed spinach, 1/2 cup Frozen yogurt, 1/2 cup Tea or coffee      LEVEL 2 = SOFT DIET  After your first 2 weeks, you can advance to a soft diet.   Keep on this diet until everything goes down easily.  Hot Foods Cold Foods  White fish  Cottage cheese  Stuffed fish Junior baby fruit  Baby food meals Semi thickened juices  Minced soft cooked, scrambled, poached eggs nectars  Souffle & omelets Ripe mashed bananas  Cooked cereals Canned fruit, pineapple sauce, milk  potatoes Milkshake  Buttered or Alfredo noodles Custard  Cooked cooled vegetable Puddings, including tapioca  Sherbet Yogurt  Vegetable soup or alphabet soup Fruit ice, [place] ice  Gravies Whipped gelatin  Sugar, syrup, honey, jelly Junior baby desserts  Sauces:  Cheese, creamed, barbecue, tomato, white Cream  Coffee or tea Margarine   SAMPLE MENU:  LEVEL 2 Breakfast Lunch Dinner  Orange juice, 1/2 cup Oatmeal, 1/2 cup Scrambled eggs with cheese, 1/2 cup Decaffeinated tea, 1 cup Whole milk, 1 cup Non-dairy creamer, 2 Tbsp Pineapple juice, 1/2 cup Minced beef, 3 oz Gravy, 2 Tbsp Mashed potatoes, 1/2 cup Minced fresh broccoli, 1/2 cup Applesauce, 1/2 cup Coffee, 1 cup [place], barley soup, 3/4 cup Minced Hawaiian chicken, 3 oz  Non-dairy creamer, 2 Tbsp  Pineapple juice, 1/2 cup  Minced beef, 3 oz  Gravy, 2 Tbsp  Mashed potatoes, 1/2 cup  Minced fresh broccoli, 1/2 cup  Applesauce, 1/2 cup  Coffee, 1 cup  [place], barley soup, 3/4 cup  Minced Hawaiian chicken, 3 oz  Mashed potatoes, 1/2 cup  Cooked spinach, 1/2 cup  Frozen yogurt, 1/2 cup  Non-dairy creamer, 2 Tbsp      LEVEL 3 = CHOPPED DIET  -After all the foods in level 2 (soft diet) are passing through well you should advance up to more chopped foods.  -It is still important to cut these foods into small pieces and eat slowly.  Hot Foods Cold Foods  Poultry Cottage cheese  Chopped Swedish meatballs Yogurt  Meat salads (ground or flaked meat) Milk  Flaked fish (tuna) Milkshakes  Poached or scrambled eggs Soft, cold, dry cereal  Souffles and omelets Fruit juices or nectars  Cooked cereals Chopped canned fruit  Chopped French toast or pancakes Canned fruit cocktail  Noodles or pasta (no rice) Pudding, mousse, custard  Cooked vegetables (no frozen peas, corn, or mixed vegetables) Green salad  Canned small sweet peas  Ice cream  Creamed soup or vegetable soup Fruit ice, Italian ice  Pureed vegetable soup or alphabet soup Non-dairy creamer  Ground scalloped apples Margarine  Gravies Mayonnaise  Sauces:  Cheese, creamed, barbecue, tomato, white Ketchup  Coffee or tea Mustard   SAMPLE MENU:  LEVEL 3 Breakfast Lunch Dinner   Orange juice, 1/2 cup  Oatmeal, 1/2 cup  Scrambled eggs with cheese, 1/2 cup  Decaffeinated tea, 1 cup  Whole milk, 1 cup  Non-dairy creamer, 2 Tbsp  Ketchup, 1 Tbsp  Margarine, 1 tsp  Salt, 1/4 tsp  Sugar, 2 tsp  Pineapple juice, 1/2 cup  Ground beef, 3 oz  Gravy, 2 Tbsp  Mashed potatoes, 1/2 cup  Cooked spinach, 1/2 cup  Applesauce, 1/2 cup  Decaffeinated coffee  Whole milk  Non-dairy creamer, 2 Tbsp  Margarine, 1 tsp  Salt, 1/4 tsp  Pureed turkey, barley soup, 3/4 cup  Barbecue chicken, 3 oz  Mashed potatoes, 1/2 cup  Ground fresh broccoli, 1/2 cup  Frozen yogurt, 1/2 cup  Decaffeinated tea, 1 cup  Non-dairy creamer, 2 Tbsp  Margarine, 1 tsp  Salt, 1/4 tsp  Sugar, 1 tsp    LEVEL 4:  REGULAR FOODS  -Foods in this group are soft, moist, regularly textured foods.   -This level includes meat and breads, which tend to be the hardest things to swallow.   -Eat very slowly, chew well and continue to avoid carbonated drinks. -most people are at this level in 4-6 weeks  Hot Foods Cold Foods  Baked fish or skinned Soft cheeses - cottage cheese  Souffles and omelets Cream cheese  Eggs Yogurt  Stuffed shells Milk  Spaghetti with meat sauce Milkshakes  Cooked cereal Cold dry cereals (no nuts, dried fruit, coconut)  French toast or pancakes Crackers  Buttered toast Fruit juices or nectars  Noodles or pasta (no rice) Canned fruit  Potatoes (all types) Ripe bananas  Soft, cooked vegetables (no corn, lima, or baked beans) Peeled, ripe, fresh fruit  Creamed soups or vegetable soup Cakes (no nuts, dried fruit, coconut)  Canned chicken  noodle soup Plain doughnuts  Gravies Ice cream  Bacon dressing Pudding, mousse, custard  Sauces:  Cheese, creamed, barbecue, tomato, white Fruit ice, Italian ice, sherbet  Decaffeinated tea or coffee Whipped gelatin  Pork chops Regular gelatin     as needed  6) May hold gluten/wheat products from diet to see if symptoms improve.  7) May try probiotics (Align, Activa, etc) to help calm the bowels down  7) If symptoms become worse call back immediately.    If you have any questions please call our office at [place]: [phone].    #########  ################################################################  LAPAROSCOPIC SURGERY: POST OP INSTRUCTIONS  ######################################################################  EAT Gradually transition to a high fiber diet with a fiber supplement over the next few weeks after discharge.  Start with a pureed / full liquid diet (see below)  WALK Walk an hour a day.  Control your pain to do that.    CONTROL PAIN Control pain so that you can walk, sleep, tolerate sneezing/coughing, go up/down stairs.  HAVE A BOWEL MOVEMENT DAILY Keep your bowels regular to avoid problems.  OK to try a laxative to override constipation.  OK to use an antidairrheal to slow down diarrhea.  Call if not better after 2 tries  CALL IF YOU HAVE PROBLEMS/CONCERNS Call if you are still struggling despite following these instructions. Call if you have concerns not answered by these instructions  ######################################################################    DIET: Follow a light bland diet & liquids the first 24 hours after arrival home, such as soup,  liquids, starches, etc.  Be sure to drink plenty of fluids.  Quickly advance to a usual solid diet within a few days.  Avoid fast food or heavy meals as your are more likely to get nauseated or have irregular bowels.  A low-fat, high-fiber diet for the rest of your life is ideal.  Take your usually prescribed home medications unless otherwise directed.  PAIN CONTROL: Pain is best controlled by a usual combination of three different methods TOGETHER: Ice/Heat Over the counter pain medication Prescription pain medication Most patients will experience some swelling and bruising around the incisions.  Ice packs or heating pads (30-60 minutes up to 6 times a day) will help. Use ice for the first few days to help decrease swelling and bruising, then switch to heat to help relax tight/sore spots and speed recovery.  Some people prefer to use ice alone, heat alone, alternating between ice & heat.  Experiment to what works for you.  Swelling and bruising can take several weeks to resolve.   It is helpful to take an over-the-counter pain medication regularly for the first few weeks.  Choose one of the following that works best for you: Naproxen (Aleve, etc)  Two '220mg'$  tabs twice a day Ibuprofen (Advil, etc) Three '200mg'$  tabs four times a day (every meal & bedtime) Acetaminophen (Tylenol, etc) 500-'650mg'$  four times a day (every meal & bedtime) A  prescription for pain medication (such as oxycodone, hydrocodone, tramadol, gabapentin, methocarbamol, etc) should be given to you upon discharge.  Take your pain medication as prescribed.  If you are having problems/concerns with the prescription medicine (does not control pain, nausea, vomiting, rash, itching, etc), please call us [phone] to see if we need to switch you to a different pain medicine that will work better for you and/or control your side effect better. If you need a refill on your pain medication, please give [place] 48 hour notice.  contact your  pharmacy.  They will contact our office to request authorization. Prescriptions will not be filled after 5 pm or on week-ends  Avoid getting constipated.   Between the surgery and the pain medications, it is common to experience some constipation.   Increasing  fluid intake and taking a fiber supplement (such as Metamucil, Citrucel, FiberCon, MiraLax, etc) 1-2 times a day regularly will usually help prevent this problem from occurring.   A mild laxative (prune juice, Milk of Magnesia, MiraLax, etc) should be taken according to package directions if there are no bowel movements after 48 hours.   Watch out for diarrhea.   If you have many loose bowel movements, simplify your diet to bland foods & liquids for a few days.   Stop any stool softeners and decrease your fiber supplement.   Switching to mild anti-diarrheal medications (Kayopectate, Pepto Bismol) can help.   If this worsens or does not improve, please call us.  Wash / shower every day.  You may shower over the dressings as they are waterproof.  Continue to shower over incision(s) after the dressing is off.  It is good for closed incisions and even open wounds to be washed every day.  Shower every day.  Short baths are fine.  Wash the incisions and wounds clean with soap & water.    You may leave closed incisions open to air if it is dry.   You may cover the incision with clean gauze & replace it after your daily shower for comfort.  DRAIN:  You have a drain in place.  Every day change the dressing in the shower, wash around the skin exit site with soap & water and place a new dressing of gauze or band aid around the skin every day.  Keep the drain site clean & dry.  Follow up in our surgery office to discuss plans for drain removal in the near future  ACTIVITIES as tolerated:   You may resume regular (light) daily activities beginning the next day--such as daily self-care, walking, climbing stairs--gradually increasing activities as  tolerated.  If you can walk 30 minutes without difficulty, it is safe to try more intense activity such as jogging, treadmill, bicycling, low-impact aerobics, swimming, etc. Save the most intensive and strenuous activity for last such as sit-ups, heavy lifting, contact sports, etc  Refrain from any heavy lifting or straining until you are off narcotics for pain control.   DO NOT PUSH THROUGH PAIN.  Let pain be your guide: If it hurts to do something, don't do it.  Pain is your body warning you to avoid that activity for another week until the pain goes down. You may drive when you are no longer taking prescription pain medication, you can comfortably wear a seatbelt, and you can safely maneuver your car and apply brakes. You may have sexual intercourse when it is comfortable.  FOLLOW UP in our office Please call [facility] at [phone] to set up an appointment to see your surgeon in the office for a follow-up appointment approximately 2-3 weeks after your surgery. Make sure that you call for this appointment the day you arrive home to insure a convenient appointment time.  10. IF YOU HAVE DISABILITY OR FAMILY LEAVE FORMS, BRING THEM TO THE OFFICE FOR PROCESSING.  DO NOT GIVE THEM TO YOUR DOCTOR.   WHEN TO CALL us [phone]: Poor pain control Reactions / problems with new medications (rash/itching, nausea, etc)  Fever over 101.5 F (38.5 C) Inability to urinate Nausea and/or vomiting Worsening swelling or bruising Continued bleeding from incision. Increased pain, redness, or drainage from the incision   The clinic staff is available to answer your questions during regular business hours (8:30am-5pm).  Please don't hesitate to call and ask to  speak to one of our nurses for clinical concerns.   If you have a medical emergency, go to the nearest emergency room or call 911.  A surgeon from [facility], [facility], [facility] is always on call at the [facility], [place], [place], [place], [place]  [address] ? MAIN: [phone] ? TOLL FREE: [phone] ?  FAX [phone] [email or web address]  ##############################################################

## 2021-09-23 NOTE — Anesthesia Postprocedure Evaluation (Signed)
Anesthesia Post Note  Patient: Terri Mcgee  Procedure(s) Performed: XI ROBOTIC ASSISTED HIATAL HERNIA REPAIR TOUPET FUNDOPLICATION, [name]     Patient location during evaluation: PACU Anesthesia Type: General Level of consciousness: awake Pain management: pain level controlled Vital Signs Assessment: post-procedure vital signs reviewed and stable Respiratory status: spontaneous breathing Cardiovascular status: stable Postop Assessment: no apparent nausea or vomiting Anesthetic complications: no   No notable events documented.  Last Vitals:  Vitals:   09/23/21 1147 09/23/21 1320  BP: 133/68 129/80  Pulse: 91 (!) 43  Resp: 16 16  Temp: 37.2 C 37.1 C  SpO2: 93% 94%    Last Pain:  Vitals:   09/23/21 1320  TempSrc: Oral  PainSc:                  [name]

## 2021-09-23 NOTE — Transfer of Care (Signed)
Immediate Anesthesia Transfer of Care Note  Patient: Terri Mcgee  Procedure(s) Performed: Procedure(s): XI ROBOTIC ASSISTED HIATAL HERNIA REPAIR TOUPET FUNDOPLICATION, M[place] REINFORCEMENT (N/A)  Patient Location: PACU  Anesthesia Type:General  Level of Consciousness: Patient easily awoken, sedated, comfortable, cooperative, following commands, responds to stimulation.   Airway & Oxygen Therapy: Patient spontaneously breathing, ventilating well, oxygen via simple oxygen mask.  Post-op Assessment: Report given to PACU RN, vital signs reviewed and stable, moving all extremities.   Post vital signs: Reviewed and stable.  Complications: No apparent anesthesia complications  Last Vitals:  Vitals Value Taken Time  BP 145/82 09/23/21 1735  Temp    Pulse 89 09/23/21 1740  Resp    SpO2 92 % 09/23/21 1740  Vitals shown include unvalidated device data.  Last Pain:  Vitals:   09/23/21 1320  TempSrc: Oral  PainSc:          Complications: No notable events documented.

## 2021-09-23 NOTE — Anesthesia Preprocedure Evaluation (Signed)
Anesthesia Evaluation  Patient identified by MRN, date of birth, ID band Patient awake    Reviewed: NPO status , Patient's Chart, lab work & pertinent test results  Airway Mallampati: II  TM Distance: >3 FB     Dental   Pulmonary neg pulmonary ROS,    breath sounds clear to auscultation       Cardiovascular hypertension,  Rhythm:Regular Rate:Normal     Neuro/Psych negative neurological ROS     GI/Hepatic Neg liver ROS,   Endo/Other  Hypothyroidism   Renal/GU negative Renal ROS     Musculoskeletal   Abdominal   Peds  Hematology   Anesthesia Other Findings   Reproductive/Obstetrics                             Anesthesia Physical Anesthesia Plan  ASA: 3  Anesthesia Plan: General   Post-op Pain Management:    Induction:   PONV Risk Score and Plan: 3 and Ondansetron and Treatment may vary due to age or medical condition  Airway Management Planned: Oral ETT  Additional Equipment:   Intra-op Plan:   Post-operative Plan:   Informed Consent: I have reviewed the patients History and Physical, chart, labs and discussed the procedure including the risks, benefits and alternatives for the proposed anesthesia with the patient or authorized representative who has indicated his/her understanding and acceptance.     Dental advisory given  Plan Discussed with: CRNA and Anesthesiologist  Anesthesia Plan Comments:         Anesthesia Quick Evaluation

## 2021-09-23 NOTE — Progress Notes (Signed)
Transition of Care [facility]) Screening Note  Patient Details  Name: ATTICUS LEMBERGER Date of Birth: November 23, 1945  Transition of Care [facility]) CM/SW Contact:    Sherie Don, LCSW Phone Number: 09/23/2021, 10:44 AM  Transition of Care Department [facility]) has reviewed patient and no TOC needs have been identified at this time. We will continue to monitor patient advancement through interdisciplinary progression rounds. If new patient transition needs arise, please place a TOC consult.

## 2021-09-23 NOTE — Op Note (Signed)
09/23/2021  5:32 PM  PATIENT:  Terri Mcgee  76 y.o. female  Patient Care Team: Yvonna Alanis, NP as PCP - General (Adult Health Nurse Practitioner) [name], MD as Consulting Physician (Ophthalmology)  PRE-OPERATIVE DIAGNOSIS:   INCARCERATED HIATAL HERNIA WITH MESENTERO-AXIAL VOLVULUS & OBSTRUCTION  POST-OPERATIVE DIAGNOSIS:   INCARCERATED HIATAL HERNIA WITH MESENTERO-AXIAL VOLVULUS & OBSTRUCTION  PROCEDURE:    1. ROBOTIC reduction of paraesophageal hiatal hernia 2. Type II mediastinal dissection. 3. Primary repair of hiatal hernia over pledgets.  4. Anterior & posterior gastropexy. 5. Toupet (partial 240 degree posterior) fundoplication x 4 cm 6. Mesh reinforcement with absorbable mesh  SURGEON:  Adin Hector, MD  ASSIST:  RN  ANESTHESIA:   local and general  EBL:  Total I/O In: 2000 [I.V.:1800; IV Piggyback:200] Out: 650 [Urine:500; Emesis/NG output:100; Blood:50]  Delay start of Pharmacological VTE agent (>24hrs) due to surgical blood loss or risk of bleeding:  no  ANESTHESIA: 1. General anesthesia. 2. Local anesthetic in a field block around all port sites.  SPECIMEN:  Mediastinal hernia sac.  DRAINS:  A 19-French Blake drain goes from the right upper quadrant along the lesser curvature of the stomach into the mediastinum.  COUNTS:  YES  PLAN OF CARE: Admit to inpatient   PATIENT DISPOSITION:  PACU - hemodynamically stable.  INDICATION:   Woman with kyphoscoliosis who came in with nausea vomiting and chest pain.  Work-up negative for cardiopulmonary etiology.  Giant hiatal hernia with twisted stomach c/w volvulus & complete gastric outlet obstruction.  Not in septic shock.  No perforation.  Admitted stabilized.  Recommendation made for robotic reduction and repair of hiatal hernia:  The anatomy & physiology of the foregut and anti-reflux mechanism was discussed.  The pathophysiology of hiatal herniation and GERD was discussed.  Natural history  risks without surgery was discussed.   The patient's symptoms are not adequately controlled by medicines and other non-operative treatments.  I feel the risks of no intervention will lead to serious problems that outweigh the operative risks; therefore, I recommended surgery to reduce the hiatal hernia out of the chest and fundoplication to rebuild the anti-reflux valve and control reflux better.  Need for a thorough workup to rule out the differential diagnosis and plan treatment was explained.  I explained laparoscopic techniques with possible need for an open approach.  Risks such as bleeding, infection, abscess, leak, need for further treatment, heart attack, death, and other risks were discussed.   I noted a good likelihood this will help address the problem.  Goals of post-operative recovery were discussed as well.  Possibility that this will not correct all symptoms was explained.  Post-operative dysphagia, need for short-term liquid & pureed diet, inability to vomit, possibility of reherniation, possible need for medicines to help control symptoms in addition to surgery were discussed.  We will work to minimize complications.   Educational handouts further explaining the pathology, treatment options, and dysphagia diet was given as well.  Questions were answered.  The patient expresses understanding & wishes to proceed with surgery.  OR FINDINGS:   Large paraesophageal hiatal hernia with 90% of the stomach in the mediastinum.  Some ischemia at the esophagogastric junction but viable.  No evidence of perforation.  There was a 10 x 8 cm hiatal defect.  It is a primary repair over pledgets. Mesh reinforcement was used with Mesh was used: PhasixT Mesh (a knitted monofilament mesh scaffold using Poly-4-hydroxybutyrate (P4HB), a biologically derived, fully resorbable material)  The patient has a partial posterior 240 degree to Toupet fundoplication with concurrent anterior and posterior gastropexy.  Wrap  4 cm long.  DESCRIPTION:   Informed consent was confirmed.  The patient received IV antibiotics prior to incision.  The underwent general anesthesia without difficulty.  A Foley catheter sterilely placed.  The patient was positioned in split leg with arms tucked. The abdomen was prepped and draped in the sterile fashion.  Surgical time-out confirmed our plan.  I placed a 5 mm port in the left subcostal region using Varess entry technique with the patient in steep reverse Trendelenburg and left side up.  Entry was clean.  We induced carbon dioxide insufflation.  Camera inspection revealed no injury.  Under direct visualization, I placed extra ports.  I also placed a 5 mm port in the left subxiphoid region under direct visualization.  I removed that and placed an Omega-shaped rigid Nathanson liver retractor to lift the left lateral sector of the liver anteriorly to expose the esophageal hiatus.  This was secured to the bed using the iron man system.  The Xi robot was carefully docked and instruments placed and advanced under direct visualization.  We focused on dissection.  I grasped the anterior mediastinal sac at the apex of the crus.  I scored through that and got into the anterior mediastinum.  I was able to free the mediastinal sac from its attachments to the pericardium and bilateral pleura using primarily focused gentle blunt dissection as well as focused vessel sealer dissection.  I transected phrenoesophageal attachments to the inner right crus, preserving a 1-2cm cuff of mediastinal sac until I found the base of the crura.  I then came around anteriorly on the left side and freed up the phrenoesophageal attachments of the mediastinal sac on the medial part of the left crus on the superior half.  I did careful mediastinal dissection to free the mediastinal sac.  With that, we could relieve the suction cup affect of the hernia sac and help reduce the stomach back down into the abdomen, flipped back  approriately to untwist the mesentero-axial volvulus.  Some ecchymosis and mild ischemia at the cardia but rest of the stomach perked up well.  We ligated the short gastrics along the lesser curvature of the stomach about a third the way down and then came up proximally over the fundus.  We released the attachments of the stomach to the retroperitoneum until we were able to connect with the prior dissection on the left crus.  We completed the release of phrenoesophageal attachments to the medial part of the left crus down to its base.  With this, we had circumferential mobilization.    We placed the stomach and esophagus on axial tension.  I then did a Type II mediastinal dissection where I freed the esophagus from its attachments to the aorta, spine, pleura, and pericardium using primarily gentle blunt as well as focused ultrasonic dissection.  We saw the anterior & posterior vagus nerves.  Worked to try and protected although the anterior vagus was rather wispy with several strands.  The right posterior could be more easily preserved and protected.  I procedded to dissect about 20 cm proximally into the mediastinum.  With that I could straighten out the esophagus and get 5 cm of intra-abdominal length of the esophagus at a best estimation.  I freed the anterior mediastinal sac off the esophagus & stomach.  We saw the anterior vagus nerve and freed the sac off of  the vagus.  I dissected out & removed the fatty epiphrenic pads at the esophagogastric junction. With that, I could better define the esophagogastric junction.  I confirmed the the patient had 5 cm of intra-abdominal esophageal length off tension.  I mobilized the spleen and retroperitoneal fat off of the left crus and diaphragm.  I freed the left lateral sector of the liver attachments off the anterior diaphragm and especially right crus to help release tension off that.  We reduced abdominal insufflation down.  Crura had somewhat decent tissue.  I  did not feel like I need to do further diaphragmatic release  I brought the fundus of the stomach posterior to the esophagus over to the right side.  The wrap was mobile with the classic shoe shine maneuver.  Wrap became together gently.  We reflected the stomach left laterally and closed the esophageal hiatus using 0 Ethibond stitch using horizontal mattress stitches with pledgets on both sides.  I did that x3 stitches.  The crura had good substance and they came together well without any tension.  Because of the larger defect, I reinforced the repair using a 15 x 10 cm biologic Phasix mesh.  I cut out a 2x4 cm part of mesh in the middle third of the mesh such that the mesh had a broad U shape transversely, one tail 5 cm wide & the other 8cm.  We brought the mesh in and laid it over the crural repair, tails anterior over the crura.  I tucked the broader "U" tail of the mesh between the left diaphragm and the spleen, the narrower "U" tail over the right crus.  I then secured the narrower"U" tail to the right crus using 2 OV lock running suture.  Continue that down inferiorly and then across the crural closure to the left posterior retroperitoneal diaphragm.  I secured to the left lateral and left superior sides of the broader "U" tail to the left diaphragm band with 2-0 V lock running suture.   I brought the fundus of the stomach behind the esophagus and cardia to set up a fundoplication wrap.  I did a posterior gastropexy by taking of #1 Ethibond stitches to the posterior part of the right side of the wrap and thru the Phasix mesh and crural closure x3.  I placed a 0 Ethibond suture through the esophagus and then the most superior medial corner of the gastric wrap.  Final bite through the Phasix mesh. alad wrap and then phasic's mesh and tied that down for right anterior gastropexy.  I then did a similar stitch to create a left anterior gastropexy as well.  That way the stomach covered the mesh and protected  it from any esophageal exposure.  With the anterior and posterior gastropexy's, stomach laid well for a fundoplication wrap.   I then did a classic 4 cm Toupet fundoplication on the true esophagus above the cardia using Ethibond stitch in the left side of the wrap, then anterior esophagus, and tied that down.  Did on the right side in mirror-image fashion.  Did another pair.  That way there were 3 pairs of a soft go gastric wrap sutures for a posterior 2 and 40 agreed to Toupet wrap..  I measured it and it was 4 cm in length.  The wrap was soft and floppy.  I placed a drain as noted above.  I did irrigation and ensured hemostasis.  I saw no evidence of any leak or perforation or other abnormality.  I removed the [facility] liver retractor under direct visualization.  I evacuated carbon dioxide and removed the ports.  The skin was closed with Monocryl and sterile dressings applied.  The patient is being extubated and brought back to the recovery room.  I discussed postop care in detail with the patient and family in in the office.  Discussed again with the patient in the holding area. I discussed operative findings, updated the patient's status, discussed probable steps to recovery, and gave postoperative recommendations to the patient's family.  Recommendations were made.  Questions were answered.  She expressed understanding & appreciation.   Adin Hector, M.D., F.A.C.S. Gastrointestinal and Minimally Invasive Surgery [facility] [address], [place], [place] [address] [phone] Main / Paging

## 2021-09-23 NOTE — Anesthesia Procedure Notes (Signed)
Procedure Name: Intubation Date/Time: 09/23/2021 2:03 PM  Performed by: Jonna Munro, CRNAPre-anesthesia Checklist: Patient identified, Emergency Drugs available, Suction available, Patient being monitored and Timeout performed Patient Re-evaluated:Patient Re-evaluated prior to induction Oxygen Delivery Method: Circle system utilized Preoxygenation: Pre-oxygenation with 100% oxygen Induction Type: IV induction, Rapid sequence and Cricoid Pressure applied Laryngoscope Size: Mac and 3 Grade View: Grade I Tube type: Oral Tube size: 7.0 mm Number of attempts: 2 Airway Equipment and Method: Stylet Placement Confirmation: ETT inserted through vocal cords under direct vision, positive ETCO2, CO2 detector and breath sounds checked- equal and bilateral Secured at: 21 cm Tube secured with: Tape Dental Injury: Teeth and Oropharynx as per pre-operative assessment

## 2021-09-24 ENCOUNTER — Inpatient Hospital Stay (HOSPITAL_COMMUNITY): Payer: PPO

## 2021-09-24 LAB — CBC
HCT: 39.4 % (ref 36.0–46.0)
Hemoglobin: 12.7 g/dL (ref 12.0–15.0)
MCH: 31.4 pg (ref 26.0–34.0)
MCHC: 32.2 g/dL (ref 30.0–36.0)
MCV: 97.5 fL (ref 80.0–100.0)
Platelets: 170 10*3/uL (ref 150–400)
RBC: 4.04 MIL/uL (ref 3.87–5.11)
RDW: 14 % (ref 11.5–15.5)
WBC: 11.9 10*3/uL — ABNORMAL HIGH (ref 4.0–10.5)
nRBC: 0 % (ref 0.0–0.2)

## 2021-09-24 MED ORDER — ENOXAPARIN SODIUM 40 MG/0.4ML IJ SOSY
40.0000 mg | PREFILLED_SYRINGE | INTRAMUSCULAR | Status: DC
  Administered 2021-09-24 – 2021-09-25 (×2): 40 mg via SUBCUTANEOUS
  Filled 2021-09-24 (×2): qty 0.4

## 2021-09-24 MED ORDER — IOHEXOL 300 MG/ML  SOLN
50.0000 mL | Freq: Once | INTRAMUSCULAR | Status: AC | PRN
Start: 2021-09-24 — End: 2021-09-24
  Administered 2021-09-24: 50 mL via ORAL

## 2021-09-24 NOTE — Progress Notes (Signed)
1 Day Post-Op   Subjective/Chief Complaint: Sore but doing fine, tol some clears already, awaiting swallow   Objective: Vital signs in last 24 hours: Temp:  [97.7 F (36.5 C)-98.9 F (37.2 C)] 98.3 F (36.8 C) (07/15 0550) Pulse Rate:  [43-91] 76 (07/15 0550) Resp:  [9-18] 16 (07/15 0550) BP: (110-152)/(53-82) 113/74 (07/15 0550) SpO2:  [90 %-99 %] 93 % (07/15 0550)    Intake/Output from previous day: 07/14 0701 - 07/15 0700 In: 4646.2 [P.O.:30; I.V.:3416.2; IV Piggyback:1200] Out: 1100 [Urine:900; Emesis/NG output:100; Drains:50; Blood:50] Intake/Output this shift: No intake/output data recorded.  General nad Cv regular Pulm effort normal Ab soft approp tender incisoins clean drain seroang  Lab Results:  Recent Labs    09/23/21 0455 09/24/21 0453  WBC 13.7* 11.9*  HGB 14.1 12.7  HCT 42.4 39.4  PLT 211 170   BMET Recent Labs    09/22/21 0919 09/23/21 0455  NA 143 142  K 3.3* 3.7  CL 103 103  CO2 29 30  GLUCOSE 131* 118*  BUN 29* 28*  CREATININE 0.83 0.82  CALCIUM 8.9 8.7*   PT/INR No results for input(s): "LABPROT", "INR" in the last 72 hours. ABG No results for input(s): "PHART", "HCO3" in the last 72 hours.  Invalid input(s): "PCO2", "PO2"  Studies/Results: DG Abd Portable 1V  Result Date: 09/22/2021 CLINICAL DATA:  Enteric tube placement. EXAM: PORTABLE ABDOMEN - 1 VIEW COMPARISON:  None Available. FINDINGS: Enteric tube within the stomach below the diaphragm. Large hiatal hernia again noted. The bowel gas pattern is normal. No radio-opaque calculi or other significant radiographic abnormality are seen. Excreted contrast within both renal collecting systems. IMPRESSION: 1. Enteric tube within the stomach below the diaphragm. 2. Large hiatal hernia. Electronically Signed   By: Titus Dubin M.D.   On: 09/22/2021 13:02   CT Angio Chest/Abd/Pel for Dissection W and/or W/WO  Result Date: 09/22/2021 CLINICAL DATA:  Chest pain, nausea, vomiting,  clinical suspicion of acute aortic syndrome EXAM: CT ANGIOGRAPHY CHEST, ABDOMEN AND PELVIS TECHNIQUE: Non-contrast CT of the chest was initially obtained. Multidetector CT imaging through the chest, abdomen and pelvis was performed using the standard protocol during bolus administration of intravenous contrast. Multiplanar reconstructed images and MIPs were obtained and reviewed to evaluate the vascular anatomy. RADIATION DOSE REDUCTION: This exam was performed according to the departmental dose-optimization program which includes automated exposure control, adjustment of the mA and/or kV according to patient size and/or use of iterative reconstruction technique. CONTRAST:  117m OMNIPAQUE IOHEXOL 350 MG/ML SOLN COMPARISON:  Chest radiograph done on 09/22/2021, CT chest done on 03/03/2005 FINDINGS: CTA CHEST FINDINGS Cardiovascular: There is no demonstrable intramural hematoma in the thoracic aorta in the noncontrast images of chest. There is homogeneous enhancement in thoracic aorta. There is no demonstrable intimal flap. Major branches of the thoracic aorta appear patent. There are no intraluminal filling defects in pulmonary artery branches. Coronary artery calcifications are seen. Mediastinum/Nodes: No significant lymphadenopathy is seen. Thyroid appears to be atrophic. Lungs/Pleura: There is no focal pulmonary consolidation. Subtle increased interstitial markings are seen in the lateral aspects of both upper lung fields, more so on the right side. Subtle ground-glass densities are noted in lingula and left lower lobe. Musculoskeletal: Dextroscoliosis is seen in thoracic spine. There is a rotoscoliosis with convexity to the left at the thoracolumbar junction. Degenerative changes are noted in the left hip. Review of the MIP images confirms the above findings. CTA ABDOMEN AND PELVIS FINDINGS VASCULAR Aorta: Scattered calcifications are seen in  the aorta without evidence of dissection or focal aneurysmal dilation.  Celiac: There are coarse calcifications at the origin of celiac axis. Major branches of celiac axis appear patent. SMA: No significant stenosis. Renals: Unremarkable. IMA: Patent. Inflow: Unremarkable. Veins: Unremarkable. Review of the MIP images confirms the above findings. NON-VASCULAR Hepatobiliary: There is fatty infiltration of the liver. There is no dilation of bile ducts. Gallbladder is unremarkable. Pancreas: There is prominence of pancreatic duct. No focal abnormalities are seen. Spleen: Unremarkable. Adrenals/Urinary Tract: Adrenals are unremarkable. There is no hydronephrosis. There are no renal or ureteral stones. Urinary bladder is not distended. Stomach/Bowel: There is a large fixed hiatal hernia. There is marked distention of the intrathoracic portion of the stomach with fluid in the lumen. Greater curvature aspect of stomach appears cephalad. Findings suggest possible organo-axial volvulus of the stomach with obstruction. Small bowel loops are not dilated. Appendix is not distinctly seen. Diverticula are seen in the colon without signs of focal acute diverticulitis. Lymphatic: No significant lymphadenopathy seen. Reproductive: Unremarkable. Other: There is no ascites or pneumoperitoneum. Musculoskeletal: There is roto scoliosis at thoracolumbar junction with convexity to the left. There are few smooth marginated fluid density structures in presacral region, possibly perineural cysts. Review of the MIP images confirms the above findings. IMPRESSION: There is no evidence of dissection in the thoracic and abdominal aorta. Major branches of thoracic and abdominal aorta are patent. There is no evidence of pulmonary artery embolism. Scattered coronary artery calcifications are seen. Subtle increase in interstitial markings in the periphery of both upper lung fields may suggest scarring. Faint ground-glass densities in the lingula and left lower lobe may suggest scarring or interstitial pneumonia. There is  a large fixed hiatal hernia. There is marked distention of the stomach within the hernia along with malrotation suggesting possible organo-axial volvulus of the stomach. Surgical consultation should be considered. There is no evidence of small-bowel obstruction. There is no hydronephrosis. Fatty liver. Diverticulosis of colon. Possible perineural cysts are seen in the presacral region and pelvis. Imaging finding of gastric volvulus was relayed to Dr. Zenia Resides by telephone call. Electronically Signed   By: Elmer Picker M.D.   On: 09/22/2021 11:37    Anti-infectives: Anti-infectives (From admission, onward)    Start     Dose/Rate Route Frequency Ordered Stop   09/23/21 0600  ertapenem (INVANZ) 1,000 mg in sodium chloride 0.9 % 100 mL IVPB        1 g 200 mL/hr over 30 Minutes Intravenous On call to O.R. 09/22/21 1805 09/23/21 1421       Assessment/Plan: POD 1 lap PEH repair- SG -clears -await swallow this am -lovenox, scds   Rolm Bookbinder 09/24/2021

## 2021-09-24 NOTE — Anesthesia Postprocedure Evaluation (Signed)
Anesthesia Post Note  Patient: Terri Mcgee  Procedure(s) Performed: XI ROBOTIC ASSISTED HIATAL HERNIA REPAIR TOUPET FUNDOPLICATION, [place] REINFORCEMENT     Patient location during evaluation: PACU Anesthesia Type: General Level of consciousness: awake Pain management: pain level controlled Vital Signs Assessment: post-procedure vital signs reviewed and stable Respiratory status: spontaneous breathing Cardiovascular status: stable Postop Assessment: no apparent nausea or vomiting Anesthetic complications: no   No notable events documented.  Last Vitals:  Vitals:   09/23/21 2050 09/23/21 2159  BP: 123/62 (!) 110/59  Pulse: 78 75  Resp: 14 12  Temp: 36.5 C 36.7 C  SpO2: 94% 92%    Last Pain:  Vitals:   09/23/21 2159  TempSrc: Oral  PainSc:                  Terri Mcgee

## 2021-09-25 ENCOUNTER — Encounter (HOSPITAL_COMMUNITY): Payer: Self-pay | Admitting: Surgery

## 2021-09-25 MED ORDER — ACETAMINOPHEN 325 MG PO TABS: 650.0000 mg | ORAL_TABLET | Freq: Four times a day (QID) | ORAL | Status: DC | PRN

## 2021-09-25 MED ORDER — MORPHINE SULFATE (PF) 2 MG/ML IV SOLN: 1.0000 mg | INTRAVENOUS | Status: DC | PRN

## 2021-09-25 NOTE — Evaluation (Signed)
Occupational Therapy Evaluation Patient Details Name: Terri Mcgee MRN: [id number] DOB: 1946/01/28 Today's Date: 09/25/2021   History of Present Illness LABRENDA Mcgee is a 76 y.o. female PMH HTN, HLD, hypothyroidism who presented to [facility] via EMS with 1 day of chest pain, nausea, vomiting. 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 ROBOTIC reduction of paraesophageal hiatal hernia.   Clinical Impression   Terri Mcgee is a 76 year old woman who normally lives alone, is independent and active is now s/p abdominal surgery with complaints of pain, weakness and decreased activity tolerance. She is able to ambulate in hall with supervision - needing to use handrails at times. She is supervision for ADLs but needs min assist for bed transfers. She reports left neck/shoulder pain since surgery and randomly reports slurred/different speech since surgery. She exhibits functional ROM, strength and coordination of bilateral upper extremities with mildly weaker. Her left hand and forearm is slightly edematous. No overt deficits than generalized weakness. Patient reports she lives alone and has very limited assistance. A friend is going to drive her home and there may be a neighbor to check on her. From an ADL standpoint she is doing well except for bed mobility. There is some concern for how well patient will be able to perform IADLs at home. Would recommend optimizing physical abilities while in hospital and recommend [facility] services at discharge.      Recommendations for follow up therapy are one component of a multi-disciplinary discharge planning process, led by the attending physician.  Recommendations may be updated based on patient status, additional functional criteria and insurance authorization.   Follow Up Recommendations   Home health OT    Assistance Recommended at Discharge PRN  Patient can return home with the following Assistance with cooking/housework;Help with stairs or ramp for entrance;Assist for transportation    Functional Status Assessment  Patient has had a recent decline in their functional status and demonstrates the ability to make significant improvements in function in a reasonable and predictable amount of time.  Equipment Recommendations  None recommended by OT    Recommendations for Other Services       Precautions / Restrictions Precautions Precaution Comments: Left drain Restrictions Weight Bearing Restrictions: No      Mobility Bed Mobility Overal bed mobility: Needs Assistance Bed Mobility: Supine to Sit, Sit to Supine     Supine to sit: Min assist Sit to supine: Min assist   General bed mobility comments: Reports difficulty with bed transfers. Required min assist for bed transfers. Patient instructed on log roll technique.    Transfers Overall transfer level: Needs assistance                 General transfer comment: Supervision for ambulation and no overt loss of balance but need for hand rails.      Balance Overall balance assessment: Mild deficits observed, not formally tested                                         ADL either performed or assessed with clinical judgement   ADL Overall ADL's : Needs assistance/impaired Eating/Feeding: Independent   Grooming: Supervision/safety;Standing;Brushing hair   Upper Body Bathing: Supervision/ safety   Lower Body Bathing: Supervison/  safety;Set up   Upper Body Dressing : Supervision/safety   Lower Body Dressing: Supervision/safety Lower Body Dressing Details (indicate cue type and reason): able to use figure four method Toilet Transfer: Supervision/safety   Toileting- Clothing Manipulation and Hygiene: Supervision/safety       Functional mobility during ADLs:  Supervision/safety General ADL Comments: Patient found ambulatingin hall with tech. No device but used handrails     Vision Patient Visual Report: No change from baseline       Perception     Praxis      Pertinent Vitals/Pain Pain Assessment Pain Assessment: 0-10 Pain Location: abdomen Pain Descriptors / Indicators: Sore Pain Intervention(s): Monitored during session     Hand Dominance     Extremity/Trunk Assessment Upper Extremity Assessment Upper Extremity Assessment: RUE deficits/detail;LUE deficits/detail RUE Deficits / Details: WFL 5/5 strength RUE Sensation: WNL RUE Coordination: WNL (performed finger to thumb and finger to nose) LUE Deficits / Details: WFL ROM, reports left shoulder/neck pain, LUE grossly 4/5, edema in left hand and forearm LUE Sensation: WNL LUE Coordination: WNL (performed finger to thumb and finger to nose functionally)   Lower Extremity Assessment Lower Extremity Assessment: Defer to PT evaluation   Cervical / Trunk Assessment Cervical / Trunk Assessment: Other exceptions Cervical / Trunk Exceptions: scoliosis   Communication Communication Communication: No difficulties   Cognition Arousal/Alertness: Awake/alert Behavior During Therapy: WFL for tasks assessed/performed Overall Cognitive Status: Within Functional Limits for tasks assessed                                       General Comments       Exercises     Shoulder Instructions      Home Living Family/patient expects to be discharged to:: Private residence Living Arrangements: Alone Available Help at Discharge: Other (Comment) (Minimal assistance from a neighbor, maybe?) Type of Home: House Home Access: Stairs to enter [employer] of Steps: 2   Home Layout: One level     Bathroom Shower/Tub: Tub/shower unit;Walk-in shower   Bathroom Toilet: Handicapped height (one bathroom has handicapped height toilet)     Home Equipment: Cane - quad           Prior Functioning/Environment Prior Level of Function : Independent/Modified Independent             Mobility Comments: no device - attended four workout classes a week ADLs Comments: independent        OT Problem List: Decreased activity tolerance;Decreased strength;Pain      OT Treatment/Interventions: Self-care/ADL training;DME and/or AE instruction;Therapeutic activities;Balance training;Patient/family education    OT Goals(Current goals can be found in the care plan section) Acute Rehab OT Goals Patient Stated Goal: Return to independence and her work out classes OT Goal Formulation: With patient Time For Goal Achievement: 10/09/21 Potential to Achieve Goals: Good  OT Frequency: Min 2X/week    Co-evaluation              AM-PAC OT "6 Clicks" Daily Activity     Outcome Measure Help from another person eating meals?: None Help from another person taking care of personal grooming?: A Little Help from another person toileting, which includes using toliet, bedpan, or urinal?: A Little Help from another person bathing (including washing, rinsing, drying)?: A Little Help from another person to put on and taking off regular upper body clothing?: A Little Help from another person to put on  and taking off regular lower body clothing?: A Little 6 Click Score: 19   End of Session Nurse Communication: Mobility status  Activity Tolerance: Patient tolerated treatment well Patient left: in chair;with call bell/phone within reach  OT Visit Diagnosis: Muscle weakness (generalized) (M62.81);Pain                Time: 1516-1540 OT Time Calculation (min): 24 min Charges:  OT General Charges $OT Visit: 1 Visit OT Evaluation $OT Eval Low Complexity: 1 Low  Adrianah Prophete, OTR/L [place] [phone] Pager: [phone] 09/25/2021, 4:02 PM

## 2021-09-25 NOTE — Progress Notes (Signed)
2 Days Post-Op   Subjective/Chief Complaint: Swallow fine, tol liquids, was oob, having bowel function, some heartburn   Objective: Vital signs in last 24 hours: Temp:  [98.2 F (36.8 C)-98.7 F (37.1 C)] 98.2 F (36.8 C) (07/16 0452) Pulse Rate:  [76-88] 76 (07/16 0452) Resp:  [16-18] 18 (07/16 0452) BP: (112-143)/(63-84) 112/84 (07/16 0452) SpO2:  [90 %-97 %] 93 % (07/16 0452)    Intake/Output from previous day: 07/15 0701 - 07/16 0700 In: 1941.8 [P.O.:600; I.V.:1341.8] Out: 1130 [Urine:1100; Drains:30] Intake/Output this shift: Total I/O In: 59.4 [I.V.:59.4] Out: -   General nad Cv regular Pulm effort normal Ab soft approp tender incisoins clean drain seroang with min output  Lab Results:  Recent Labs    09/23/21 0455 09/24/21 0453  WBC 13.7* 11.9*  HGB 14.1 12.7  HCT 42.4 39.4  PLT 211 170   BMET Recent Labs    09/22/21 0919 09/23/21 0455  NA 143 142  K 3.3* 3.7  CL 103 103  CO2 29 30  GLUCOSE 131* 118*  BUN 29* 28*  CREATININE 0.83 0.82  CALCIUM 8.9 8.7*   PT/INR No results for input(s): "LABPROT", "INR" in the last 72 hours. ABG No results for input(s): "PHART", "HCO3" in the last 72 hours.  Invalid input(s): "PCO2", "PO2"  Studies/Results: DG ESOPHAGUS W SINGLE CM (SOL OR THIN BA)  Result Date: 09/24/2021 CLINICAL DATA:  Status post robotic reduction of paraesophageal hiatal hernia and Toupet fundoplication. Request for esophagram prior to advancing diet. EXAM: SINGLE CONTRAST ESOPHAGRAM TECHNIQUE: Single contrast examination was performed using water soluble Omnipaque contrast. This exam was performed by Candiss Norse, PA-C, and was supervised and interpreted by Earle Gell, MD. FLUOROSCOPY: Radiation Exposure Index (as provided by the fluoroscopic device): 24.50 mGy Kerma COMPARISON:  None FINDINGS: Swallowing: On initial swallow, there was aspiration of contrast which initiated a gag reflex. Subsequent swallows show no further aspiration  of contrast. Pharynx: Unremarkable. Esophagus: No evidence of contrast leak or extravasation from the esophagus. Mild extrinsic mass effect is seen at the GE junction secondary to fundoplication, however, contrast passes freely though this area. No evidence of esophageal stricture or obstruction. Esophageal motility: Within normal limits. Hiatal Hernia: None. Gastroesophageal reflux: None visualized. Ingested 13 mm barium tablet: Not given Other: No evidence of obstruction or leak. IMPRESSION: Extrinsic mass effect at GE junction consistent with prior fundoplication. No evidence of post-operative contrast leak, obstruction, or other complication. Electronically Signed   By: Marlaine Hind M.D.   On: 09/24/2021 12:41    Anti-infectives: Anti-infectives (From admission, onward)    Start     Dose/Rate Route Frequency Ordered Stop   09/23/21 0600  ertapenem (INVANZ) 1,000 mg in sodium chloride 0.9 % 100 mL IVPB        1 g 200 mL/hr over 30 Minutes Intravenous On call to O.R. 09/22/21 1805 09/23/21 1421       Assessment/Plan: POD 2 lap PEH repair- SG -pureed per SG -saline lock -oral meds -she is widow and lives alone- will have pt and ot see her to figure out safe dc plan -lovenox, scds    Rolm Bookbinder 09/25/2021

## 2021-09-26 DIAGNOSIS — K440 Diaphragmatic hernia with obstruction, without gangrene: Secondary | ICD-10-CM

## 2021-09-26 MED ORDER — OXYCODONE HCL 5 MG PO TABS: 5.0000 mg | ORAL_TABLET | Freq: Four times a day (QID) | ORAL | 0 refills | Status: DC | PRN

## 2021-09-26 MED ORDER — ADVANCED MULTI EA PO CHEW
2.0000 | CHEWABLE_TABLET | Freq: Every day | ORAL | Status: DC
  Administered 2021-09-26: 2 via ORAL
  Filled 2021-09-26: qty 2

## 2021-09-26 MED ORDER — PANTOPRAZOLE SODIUM 40 MG PO TBEC: 40.0000 mg | DELAYED_RELEASE_TABLET | Freq: Every day | ORAL | 1 refills | Status: DC

## 2021-09-26 MED ORDER — LACTATED RINGERS IV BOLUS: 1000.0000 mL | Freq: Three times a day (TID) | INTRAVENOUS | Status: DC | PRN

## 2021-09-26 MED ORDER — GABAPENTIN 100 MG PO CAPS: 100.0000 mg | ORAL_CAPSULE | Freq: Every day | ORAL | Status: DC

## 2021-09-26 MED ORDER — PANTOPRAZOLE SODIUM 40 MG PO TBEC
40.0000 mg | DELAYED_RELEASE_TABLET | Freq: Two times a day (BID) | ORAL | Status: DC
  Administered 2021-09-26: 40 mg via ORAL
  Filled 2021-09-26: qty 1

## 2021-09-26 MED ORDER — CALCIUM CARBONATE ANTACID 500 MG PO CHEW
CHEWABLE_TABLET | Freq: Every day | ORAL | Status: DC
  Administered 2021-09-26: 200 mg via ORAL
  Filled 2021-09-26 (×2): qty 1

## 2021-09-26 MED ORDER — SIMVASTATIN 20 MG PO TABS
20.0000 mg | ORAL_TABLET | Freq: Every day | ORAL | Status: DC
Start: 2021-09-26 — End: 2021-09-26

## 2021-09-26 MED ORDER — SODIUM CHLORIDE 0.9% FLUSH: 3.0000 mL | Freq: Two times a day (BID) | INTRAVENOUS | Status: DC

## 2021-09-26 MED ORDER — SODIUM CHLORIDE 0.9 % IV SOLN: 250.0000 mL | INTRAVENOUS | Status: DC | PRN

## 2021-09-26 MED ORDER — SODIUM CHLORIDE 0.9% FLUSH: 3.0000 mL | INTRAVENOUS | Status: DC | PRN

## 2021-09-26 MED ORDER — LISINOPRIL 10 MG PO TABS
10.0000 mg | ORAL_TABLET | Freq: Every day | ORAL | Status: DC
  Administered 2021-09-26: 10 mg via ORAL
  Filled 2021-09-26: qty 1

## 2021-09-26 MED ORDER — HYDROCHLOROTHIAZIDE 12.5 MG PO TABS
12.5000 mg | ORAL_TABLET | Freq: Every day | ORAL | Status: DC
  Administered 2021-09-26: 12.5 mg via ORAL
  Filled 2021-09-26: qty 1

## 2021-09-26 MED ORDER — ACETAMINOPHEN 500 MG PO TABS
1000.0000 mg | ORAL_TABLET | Freq: Three times a day (TID) | ORAL | Status: DC
  Administered 2021-09-26: 1000 mg via ORAL
  Filled 2021-09-26: qty 2

## 2021-09-26 MED ORDER — LISINOPRIL-HYDROCHLOROTHIAZIDE 10-12.5 MG PO TABS: 1.0000 | ORAL_TABLET | Freq: Every day | ORAL | Status: DC

## 2021-09-26 MED ORDER — LEVOTHYROXINE SODIUM 75 MCG PO TABS: 75.0000 ug | ORAL_TABLET | Freq: Every day | ORAL | Status: DC

## 2021-09-26 MED ORDER — GABAPENTIN 100 MG PO CAPS
200.0000 mg | ORAL_CAPSULE | Freq: Every day | ORAL | Status: DC
Start: 2021-09-26 — End: 2021-09-26

## 2021-09-26 MED ORDER — ONDANSETRON HCL 4 MG PO TABS: 4.0000 mg | ORAL_TABLET | Freq: Three times a day (TID) | ORAL | 10 refills | Status: DC | PRN

## 2021-09-26 MED ORDER — ASPIRIN 81 MG PO CHEW
81.0000 mg | CHEWABLE_TABLET | Freq: Every day | ORAL | Status: DC
  Administered 2021-09-26: 81 mg via ORAL
  Filled 2021-09-26: qty 1

## 2021-09-26 MED ORDER — CENTRUM FRESH/FRUITY ADULT PO CHEW
CHEWABLE_TABLET | Freq: Every day | ORAL | Status: DC
  Filled 2021-09-26: qty 1

## 2021-09-26 MED ORDER — TURMERIC 500 MG PO CAPS: 500.0000 mg | ORAL_CAPSULE | Freq: Every day | ORAL | Status: DC

## 2021-09-26 NOTE — Progress Notes (Signed)
Occupational Therapy Treatment Patient Details Name: Terri Mcgee MRN: [id number] DOB: Dec 01, 1945 Today's Date: 09/26/2021   History of present illness Terri Mcgee is a 76 y.o. female PMH HTN, HLD, hypothyroidism who presented to [facility] via EMS with 1 day of chest pain, nausea, vomiting. 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 pt is s/p ROBOTIC reduction of paraesophageal hiatal hernia and mediastainal dissection on 09/23/21   OT comments  Patient reported having pain in L shoulder with noted edema in hand. Nurse made aware. Patient was educated on keeping L hand elevated with patient having difficulty carrying over education. Patient was educated on bed mobility without bed rail as patient does not have one at home. Patient reported " I cant do it without bed rail". Patient was then educated on purchasing bed rail for bed at home. Patient reported " I dont want one they are tacky". Patient then reported " they are only for old people". Patient was educated on benefits of being able to safely navigate out of bed with AD v.s without. Patient was able to complete toilet transfer with supervision on this date. Patient would continue to benefit from skilled OT services at this time while admitted and after d/c to address noted deficits in order to improve overall safety and independence in ADLs.     Recommendations for follow up therapy are one component of a multi-disciplinary discharge planning process, led by the attending physician.  Recommendations may be updated based on patient status, additional functional criteria and insurance authorization.    Follow Up Recommendations  Home health OT    Assistance Recommended at Discharge Intermittent Supervision/Assistance  Patient can return home with the following  Assistance  with cooking/housework;Help with stairs or ramp for entrance;Assist for transportation   Equipment Recommendations  None recommended by OT    Recommendations for Other Services      Precautions / Restrictions Precautions Precautions: Fall Precaution Comments: Left drain Restrictions Weight Bearing Restrictions: No       Mobility Bed Mobility Overal bed mobility: Needs Assistance Bed Mobility: Supine to Sit, Sit to Supine     Supine to sit: Min assist Sit to supine: Min assist   General bed mobility comments: patient was educated on rolling technique to not need bed rails. patient unable to process how to push up without bedrail infront of her. patient was educated on bed rails at home. patient reported she did not want one because it was "tacky" and " only for old people".    Transfers                         Balance Overall balance assessment: Needs assistance Sitting-balance support: No upper extremity supported, Feet supported Sitting balance-Leahy Scale: Good     Standing balance support: During functional activity, Single extremity supported Standing balance-Leahy Scale: Fair                             ADL either performed or assessed with clinical judgement   ADL Overall ADL's : Needs assistance/impaired                         Toilet Transfer: Copy Details (indicate cue type and reason): with patient furniture  walking to anf from bathroom with cues to not reach out for furntiure.                Extremity/Trunk Assessment Upper Extremity Assessment Upper Extremity Assessment: Defer to OT evaluation LUE Deficits / Details: WFL ROM, reports left shoulder/neck pain, LUE grossly 4/5, edema in left hand and forearm (same findings at time of PT eval as reported on OT eval)            Vision       Perception     Praxis      Cognition Arousal/Alertness: Awake/alert Behavior During  Therapy: WFL for tasks assessed/performed Overall Cognitive Status: Within Functional Limits for tasks assessed                                 General Comments: confusion with use of bed rail with difficulty following directions        Exercises      Shoulder Instructions       General Comments      Pertinent Vitals/ Pain       Pain Assessment Pain Assessment: Faces Faces Pain Scale: Hurts little more Pain Location: L shoulder Pain Descriptors / Indicators: Discomfort, Grimacing Pain Intervention(s): Monitored during session, Repositioned  Home Living Family/patient expects to be discharged to:: Private residence Living Arrangements: Alone Available Help at Discharge: Other (Comment) Type of Home: House Home Access: Stairs to enter [employer] of Steps: 2 Entrance Stairs-Rails: Right;Left;Can reach both Home Layout: One level               Home Equipment: Cane - quad          Prior Functioning/Environment              Frequency  Min 2X/week        Progress Toward Goals  OT Goals(current goals can now be found in the care plan section)  Progress towards OT goals: Progressing toward goals     Plan Discharge plan remains appropriate    Co-evaluation                 AM-PAC OT "6 Clicks" Daily Activity     Outcome Measure   Help from another person eating meals?: None Help from another person taking care of personal grooming?: A Little Help from another person toileting, which includes using toliet, bedpan, or urinal?: A Little Help from another person bathing (including washing, rinsing, drying)?: A Little Help from another person to put on and taking off regular upper body clothing?: A Little Help from another person to put on and taking off regular lower body clothing?: A Little 6 Click Score: 19    End of Session Equipment Utilized During Treatment: Gait belt  OT Visit Diagnosis: Muscle weakness  (generalized) (M62.81);Pain   Activity Tolerance Patient tolerated treatment well   Patient Left in chair;with call bell/phone within reach   Nurse Communication Mobility status        Time: 9528-4132 OT Time Calculation (min): 14 min  Charges: OT General Charges $OT Visit: 1 Visit OT Treatments $Therapeutic Activity: 8-22 mins  Jackelyn Poling OTR/L, MS Acute Rehabilitation Department Office# [phone] Pager# [phone]   Marcellina Millin 09/26/2021, 12:56 PM

## 2021-09-26 NOTE — Progress Notes (Signed)
Reviewed written d/c instructions w pt's family Vaughan Basta) and all questions answered. She verbalized understanding. Instructed that pt should not be left by herself and that HHPT will be calling the pt this afternoon to set up appts. D/C via w/c w all belongings in stable condition.

## 2021-09-26 NOTE — Progress Notes (Addendum)
LM for Levander Campion (sister in law) to call me regarding pt's d/c orders. [phone]

## 2021-09-26 NOTE — Progress Notes (Signed)
LM for Vaughan Basta (pt's nearest relative) to call me re d/c instructions for pt. Terri Mcgee [phone]. Reordered [facility] consult to assist w Joseph post d/c.

## 2021-09-26 NOTE — Progress Notes (Signed)
Have not yet received a return phone call from Vaughan Basta, so called Dub Mikes at [phone] and LM for her to call re d/c instrucitons for pt.

## 2021-09-26 NOTE — Progress Notes (Signed)
Terri Mcgee [id number] 1945/03/23  CARE TEAM:  PCP: Yvonna Alanis, NP  Outpatient Care Team: Patient Care Team: Yvonna Alanis, NP as PCP - General (Adult Health Nurse Practitioner) [name], MD as Consulting Physician (Ophthalmology)  Inpatient Treatment Team: Treatment Team: Attending Provider: Nolon Nations, MD; Consulting Physician: Nolon Nations, MD; Consulting Physician: Michael Boston, MD; Charge Nurse: Arminda Resides, RN; Utilization Review: Beau Fanny, RN; Registered Nurse: Dorinda Hill, RN; Occupational Therapist: Marcellina Millin, OT; Physical Therapist: Neil Crouch, PT; Pharmacist: Emiliano Dyer, [facility]   Problem List:   Principal Problem:   Gastric volvulus Active Problems:   Allergic rhinitis   Hyperlipidemia   Hypothyroidism   Essential hypertension   3 Days Post-Op  09/23/2021  POST-OPERATIVE DIAGNOSIS:   INCARCERATED HIATAL HERNIA WITH MESENTERO-AXIAL VOLVULUS & OBSTRUCTION   PROCEDURE:     1. ROBOTIC reduction of paraesophageal hiatal hernia 2. Type II mediastinal dissection. 3. Primary repair of hiatal hernia over pledgets.  4. Anterior & posterior gastropexy. 5. Toupet (partial 240 degree posterior) fundoplication x 4 cm 6. Mesh reinforcement with absorbable mesh   SURGEON:  Adin Hector, MD  OR FINDINGS:    Large paraesophageal hiatal hernia with 90% of the stomach in the mediastinum.  Some ischemia at the esophagogastric junction but viable.  No evidence of perforation.  There was a 10 x 8 cm hiatal defect.   It is a primary repair over pledgets. Mesh reinforcement was used with Mesh was used: PhasixT Mesh (a knitted monofilament mesh scaffold using Poly-4-hydroxybutyrate (P4HB), a biologically derived, fully resorbable material)   The patient has a partial posterior 240 degree to Toupet fundoplication with concurrent anterior and posterior gastropexy.  Wrap 4 cm long.  Assessment  Recovering  [facility] Stay = 4  days)  Plan:  -Tolerating dysphagia 1 diet.  Have nutrition come by to offer advice.  Nutritional instructions written as well to gradually advance from level 1 to level 4 (clears, pured, soft, unrestricted solid diet).  Usually needs pureed diet x2 weeks at first. -Switch to oral levothyroxine. -Resume home hypertensive meds with hydrochlorothiazide.  Should help with some postoperative edema. -Drain out put is going down and is serosanguineous.  Patient nontoxic.  No evidence of leak..  Can be safely removed. -VTE prophylaxis- SCDs, etc -mobilize as tolerated to help recovery.  I think she would benefit from home health safety and occupational therapy visits to check on her since she lives by herself and seems hesitant to ask for much help aside from a ride home.    Disposition:  Disposition:  The patient is from: Home  Anticipate discharge to:  Home with Home Health  Anticipated Date of Discharge is:  July 17,2023    Barriers to discharge:  Pending Clinical improvement (more likely than not)  Patient currently is  close to be stable  for discharge from the hospital from a surgery standpoint.      I reviewed nursing notes, last 24 h vitals and pain scores, last 48 h intake and output, last 24 h labs and trends, and last 24 h imaging results. I have reviewed this patient's available data, including medical history, events of note, test results, etc as part of my evaluation.  A significant portion of that time was spent in counseling.  Care during the described time interval was provided by me.  This care required moderate level of medical decision making.  09/26/2021    Subjective: (Chief complaint)  Patient  sitting up at chair, tolerating pured food  Some mild left shoulder pain  On room air.  Notes her hands are little puffy but otherwise feeling well.  Wants to make sure she is back on her home blood pressure meds  Objective:  Vital signs:  Vitals:    09/25/21 0452 09/25/21 1325 09/25/21 2121 09/26/21 0519  BP: 112/84 126/84 (!) 153/78 125/73  Pulse: 76 88 95 81  Resp: '18 14 16 17  '$ Temp: 98.2 F (36.8 C) 97.8 F (36.6 C) (!) 97.5 F (36.4 C) 98.4 F (36.9 C)  TempSrc: Oral Oral Oral Oral  SpO2: 93% 94% 92% 93%  Weight:      Height:           Intake/Output   Yesterday:  07/16 0701 - 07/17 0700 In: 1139.4 [P.O.:1080; I.V.:59.4] Out: 2890 [Urine:2850; Drains:40] This shift:  No intake/output data recorded.  Bowel function:  Flatus: YES  BM:  No  Drain: Serosanguinous   Physical Exam:  General: Pt awake/alert in no acute distress Eyes: PERRL, normal EOM.  Sclera clear.  No icterus Neuro: CN II-XII intact w/o focal sensory/motor deficits. Lymph: No head/neck/groin lymphadenopathy Psych:  No delerium/psychosis/paranoia.  Oriented x 4 HENT: Normocephalic, Mucus membranes moist.  No thrush Neck: Supple, No tracheal deviation.  No obvious thyromegaly Chest: No pain to chest wall compression.  Good respiratory excursion.  No audible wheezing CV:  Pulses intact.  Regular rhythm.  No major extremity edema MS: Normal AROM mjr joints.  No obvious deformity  Abdomen: Soft.  Nondistended.  Mildly tender at incisions only.  No evidence of peritonitis.  No incarcerated hernias.  Ext:   No deformity.  No mjr edema.  No cyanosis Skin: No petechiae / purpurea.  No major sores.  Warm and dry    Results:   Cultures: Recent Results (from the past 720 hour(s))  Surgical pcr screen     Status: None   Collection Time: 09/22/21  7:32 PM   Specimen: Nasal Mucosa; Nasal Swab  Result Value Ref Range Status   MRSA, PCR NEGATIVE NEGATIVE Final   Staphylococcus aureus NEGATIVE NEGATIVE Final    Comment: (NOTE) The Xpert SA Assay (FDA approved for NASAL specimens in patients 28 years of age and older), is one component of a comprehensive surveillance program. It is not intended to diagnose infection nor to guide or monitor  treatment. Performed at [facility], [place] [address] [place], [place] [address]     Labs: No results found for this or any previous visit (from the past 48 hour(s)).  Imaging / Studies: DG ESOPHAGUS W SINGLE CM (SOL OR THIN BA)  Result Date: 09/24/2021 CLINICAL DATA:  Status post robotic reduction of paraesophageal hiatal hernia and Toupet fundoplication. Request for esophagram prior to advancing diet. EXAM: SINGLE CONTRAST ESOPHAGRAM TECHNIQUE: Single contrast examination was performed using water soluble Omnipaque contrast. This exam was performed by Candiss Norse, PA-C, and was supervised and interpreted by Earle Gell, MD. FLUOROSCOPY: Radiation Exposure Index (as provided by the fluoroscopic device): 24.50 mGy Kerma COMPARISON:  None FINDINGS: Swallowing: On initial swallow, there was aspiration of contrast which initiated a gag reflex. Subsequent swallows show no further aspiration of contrast. Pharynx: Unremarkable. Esophagus: No evidence of contrast leak or extravasation from the esophagus. Mild extrinsic mass effect is seen at the GE junction secondary to fundoplication, however, contrast passes freely though this area. No evidence of esophageal stricture or obstruction. Esophageal motility: Within normal limits. Hiatal Hernia: None. Gastroesophageal  reflux: None visualized. Ingested 13 mm barium tablet: Not given Other: No evidence of obstruction or leak. IMPRESSION: Extrinsic mass effect at GE junction consistent with prior fundoplication. No evidence of post-operative contrast leak, obstruction, or other complication. Electronically Signed   By: Marlaine Hind M.D.   On: 09/24/2021 12:41    Medications / Allergies: per chart  Antibiotics: Anti-infectives (From admission, onward)    Start     Dose/Rate Route Frequency Ordered Stop   09/23/21 0600  ertapenem (INVANZ) 1,000 mg in sodium chloride 0.9 % 100 mL IVPB        1 g 200 mL/hr over 30 Minutes Intravenous On  call to O.R. 09/22/21 1805 09/23/21 1421         Note: Portions of this report may have been transcribed using voice recognition software. Every effort was made to ensure accuracy; however, inadvertent computerized transcription errors may be present.   Any transcriptional errors that result from this process are unintentional.    Adin Hector, MD, FACS, MASCRS Esophageal, Gastrointestinal & Colorectal Surgery Robotic and Minimally Invasive Surgery  [facility]. [address] [place], [place] [address] [phone] Fax [phone] Main  CONTACT INFORMATION:  Weekday (9AM-5PM): Call [facility] main office at [phone]  Weeknight (5PM-9AM) or Weekend/Holiday: Check [email or web address] (password " [email or web address]") for General Surgery [facility] coverage  (Please, do not use SecureChat as it is not reliable communication to reach operating surgeons for immediate patient care)      09/26/2021  7:49 AM

## 2021-09-26 NOTE — Evaluation (Signed)
Physical Therapy Evaluation Patient Details Name: Terri Mcgee MRN: [id number] DOB: Jun 30, 1945 Today's Date: 09/26/2021  History of Present Illness  Terri Mcgee is a 76 y.o. female PMH HTN, HLD, hypothyroidism who presented to [facility] via EMS with 1 day of chest pain, nausea, vomiting. 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 pt is s/p ROBOTIC reduction of paraesophageal hiatal hernia and mediastainal dissection on 09/23/21  Clinical Impression  Pt admitted with above diagnosis.  Pt with very unsteady gait,reliant  on at least unilateral UE support. Pt reports multiple falls. Will benefit from HHPT but may refuse d/t household condition. Will benefit likely from [facility], do not think pt has space to utilize RW at home.  Pt currently with functional limitations due to the deficits listed below (see PT Problem List). Pt will benefit from skilled PT to increase their independence and safety with mobility to allow discharge to the venue listed below.          Recommendations for follow up therapy are one component of a multi-disciplinary discharge planning process, led by the attending physician.  Recommendations may be updated based on patient status, additional functional criteria and insurance authorization.  Follow Up Recommendations Home health PT      Assistance Recommended at Discharge PRN  Patient can return home with the following  Help with stairs or ramp for entrance;Assistance with cooking/housework    Equipment Recommendations  (may need SPC if agreeable)  Recommendations for Other Services       Functional Status Assessment Patient has had a recent decline in their functional status and demonstrates the ability to make significant improvements in function in a reasonable and predictable amount of time.      Precautions / Restrictions Precautions Precautions: Fall Restrictions Weight Bearing Restrictions: No      Mobility  Bed Mobility               General bed mobility comments: NT- in recliner    Transfers Overall transfer level: Needs assistance   Transfers: Sit to/from Stand             General transfer comment: Supervision for safety, reliant on  arm rests/UEs    Ambulation/Gait Ambulation/Gait assistance: Min guard Gait Distance (Feet): 160 Feet Assistive device: None, 1 person hand held assist Gait Pattern/deviations: Step-through pattern, Decreased stride length, Narrow base of support       General Gait Details: overall unsteady gait, repeated attempts to hold rail in hallway but without overt LOB  Stairs            Wheelchair Mobility    Modified Rankin (Stroke Patients Only)       Balance Overall balance assessment: Needs assistance Sitting-balance support: No upper extremity supported, Feet supported Sitting balance-Leahy Scale: Good     Standing balance support: During functional activity, Single extremity supported Standing balance-Leahy Scale: Fair Standing balance comment: cannot tolerate challenges able to static stand without UE support             High level balance activites: Head turns, Direction changes High Level Balance Comments: min/guard for safety             Pertinent Vitals/Pain Pain Assessment Pain Assessment: No/denies pain    Home Living Family/patient expects to be discharged to:: Private residence Living Arrangements: Alone Available Help at Discharge: Other (  Comment) Type of Home: House Home Access: Stairs to enter Entrance Stairs-Rails: Right;Left;Can reach both Entrance Stairs-Number of Steps: 2   Home Layout: One level Home Equipment: Cane - quad      Prior Function Prior Level of Function : Independent/Modified Independent             Mobility Comments: no device - attended four  workout classes a week; endorses multiple falls, the last one a few most ago in parking lot of grocery store ADLs Comments: independent     Hand Dominance        Extremity/Trunk Assessment   Upper Extremity Assessment Upper Extremity Assessment: Defer to OT evaluation LUE Deficits / Details: WFL ROM, reports left shoulder/neck pain, LUE grossly 4/5, edema in left hand and forearm (same findings at time of PT eval as reported on OT eval)            Communication   Communication: No difficulties  Cognition Arousal/Alertness: Awake/alert Behavior During Therapy: WFL for tasks assessed/performed Overall Cognitive Status: Within Functional Limits for tasks assessed                                 General Comments: RN reports confusion at night; Ox3 at time of PT eval        General Comments      Exercises     Assessment/Plan    PT Assessment Patient needs continued PT services  PT Problem List Decreased activity tolerance;Decreased balance;Decreased knowledge of use of DME       PT Treatment Interventions DME instruction;Therapeutic exercise;Gait training;Functional mobility training;Stair training    PT Goals (Current goals can be found in the Care Plan section)  Acute Rehab PT Goals PT Goal Formulation: With patient Time For Goal Achievement: 10/10/21 Potential to Achieve Goals: Good    Frequency Min 3X/week     Co-evaluation               AM-PAC PT "6 Clicks" Mobility  Outcome Measure Help needed turning from your back to your side while in a flat bed without using bedrails?: None Help needed moving from lying on your back to sitting on the side of a flat bed without using bedrails?: A Little Help needed moving to and from a bed to a chair (including a wheelchair)?: A Little Help needed standing up from a chair using your arms (e.g., wheelchair or bedside chair)?: A Little Help needed to walk in hospital room?: A Little Help needed  climbing 3-5 steps with a railing? : A Little 6 Click Score: 19    End of Session Equipment Utilized During Treatment: Gait belt Activity Tolerance: Patient tolerated treatment well Patient left: in chair;with call bell/phone within reach   PT Visit Diagnosis: Other abnormalities of gait and mobility (R26.89);Difficulty in walking, not elsewhere classified (R26.2)    Time: 1009-1020 PT Time Calculation (min) (ACUTE ONLY): 11 min   Charges:   PT Evaluation $PT Eval Low Complexity: [place], PT  Acute Rehab Dept [facility]) [phone]  [facility] Weekend Pager [facility] only)  [phone]  09/26/2021   [facility] 09/26/2021, 10:56 AM

## 2021-09-26 NOTE — TOC Transition Note (Signed)
Transition of Care [facility]) - CM/SW Discharge Note  Patient Details  Name: Terri Mcgee MRN: [id number] Date of Birth: 04-20-1945  [facility] CM/SW Contact:  Sherie Don, LCSW Phone Number: 09/26/2021, 12:39 PM  Clinical Narrative: PT and OT evaluations recommended HH. Patient is agreeable to referral. CSW made referrals to the following agencies:  [facility]: declined [facility]: declined [facility]: accepted  CSW updated patient regarding [facility] accepting the referral. [place] orders are in. TOC signing off.  Final next level of care: [place] Barriers to Discharge: No Barriers Identified  Patient Goals and CMS Choice Patient states their goals for this hospitalization and ongoing recovery are:: Return home CMS Medicare.gov Compare Post Acute Care list provided to:: Patient Choice offered to / list presented to : Patient  Discharge Plan and Services         DME Arranged: N/A DME Agency: NA HH Arranged: PT, OT HH Agency: [place] Date [facility] Agency Contacted: 09/26/21 Time HH Agency Contacted: 0931 Representative spoke with at [place]: Cindie  Readmission Risk Interventions     No data to display

## 2021-09-26 NOTE — Discharge Summary (Signed)
[facility] Discharge Summary   Patient ID: Terri Mcgee MRN: [id number] DOB/AGE: 17-May-1945 76 y.o.  Admit date: 09/22/2021 Discharge date: 09/26/2021  Admitting Diagnosis: Gastric volvulus  Discharge Diagnosis Patient Active Problem List   Diagnosis Date Noted   Incarcerated hiatal hernia s/p robotic reduction/repair 09/23/2021 09/26/2021   Gastric volvulus 09/22/2021   Pain due to onychomycosis of toenails of both feet 08/23/2021   Callus 08/23/2021   Hyperlipidemia 10/12/2016   Hypothyroidism 10/12/2016   Essential hypertension 10/12/2016   Allergic rhinitis 05/13/2013   Osteopenia 11/28/2012    Consultants None   Imaging: No results found.  Procedures Dr. Michael Boston (09/23/21) - robotic reduction of paraesophageal hiatal hernia, Type II mediastinal dissection, primary repair of hiatal hernia, anterior and posterior gastropexy, toupet fundoplication, mesh reinforcement with absorbable mesh  Hospital Course:  Patient is a 76 year old who presented to the ED with chest pain and nausea/vomiting.  Workup showed gastric volvulus.  Patient was admitted and underwent procedure listed above.  Tolerated procedure well and was transferred to the floor.  Diet was advanced as tolerated.  On POD3, the patient was voiding well, tolerating diet, ambulating well, pain well controlled, vital signs stable, incisions c/d/i and felt stable for discharge home.  Patient will follow up in our office in 3 weeks and knows to call with questions or concerns. She will call to confirm appointment date/time.     I or a member of my team have reviewed this patient in the Controlled Substance Database.   Allergies as of 09/26/2021       Reactions   Penicillins Shortness Of Breath, Rash   As a child   Terbinafine And Related    Headache, blurred vision, dizziness   Tetanus Toxoid Adsorbed Rash        Medication List     TAKE these medications    acetaminophen 500 MG  tablet Commonly known as: TYLENOL Take 500-1,000 mg by mouth daily as needed for mild pain. 1-2 tablets   aspirin 81 MG chewable tablet Chew 81 mg by mouth daily.   CALCIUM 1200 PO Take 1 tablet by mouth daily.   gabapentin 100 MG capsule Commonly known as: NEURONTIN Take 1 capsule by mouth at bedtime   levothyroxine 75 MCG tablet Commonly known as: Euthyrox Take 1 tablet (75 mcg total) by mouth daily.   lisinopril-hydrochlorothiazide 10-12.5 MG tablet Commonly known as: ZESTORETIC Take 1 tablet by mouth once daily   MULTIVITAMIN GUMMIES ADULT PO Take 1 capsule by mouth daily in the afternoon.   ondansetron 4 MG tablet Commonly known as: ZOFRAN Take 1 tablet (4 mg total) by mouth every 8 (eight) hours as needed for nausea.   oxyCODONE 5 MG immediate release tablet Commonly known as: Oxy IR/ROXICODONE Take 1 tablet (5 mg total) by mouth every 6 (six) hours as needed for moderate pain, severe pain or breakthrough pain.   pantoprazole 40 MG tablet Commonly known as: Protonix Take 1 tablet (40 mg total) by mouth daily.   Pfizer COVID-19 Vac Bivalent injection Generic drug: COVID-19 mRNA bivalent vaccine Therapist, music)   simvastatin 20 MG tablet Commonly known as: ZOCOR TAKE 1 TABLET BY MOUTH ONCE DAILY AT 6 PM What changed: See the new instructions.   Turmeric 500 MG Caps Take 500 mg by mouth daily.          Follow-up Information     Michael Boston, MD. Go on 10/20/2021.   Specialties: General Surgery, Colon and Rectal Surgery Why:  1:30 PM. Please arrive 30 min prior to appointment time for check in. Please have ID and insurance information with you. Contact information: [address] [place] [address] [phone]         Care, [facility] Follow up.   Specialty: Home Health Services Why: [facility] will provide PT and OT in the home after discharge. Contact information: [address] [place] [address] [phone]                  Signed: Norm Parcel , [facility] 09/26/2021, 1:43 PM Please see Amion for pager number during day hours 7:00am-4:30pm

## 2021-09-27 ENCOUNTER — Telehealth: Payer: Self-pay

## 2021-09-27 LAB — SURGICAL PATHOLOGY

## 2021-09-27 NOTE — Telephone Encounter (Signed)
Was unable to reach patient LVM to return call to office to schedule. PT does have a F/U on 8/10 with general surgeon as well as [place] with [facility] for OT/ and PT

## 2021-09-28 ENCOUNTER — Telehealth: Payer: Self-pay | Admitting: *Deleted

## 2021-09-28 ENCOUNTER — Telehealth: Payer: Self-pay

## 2021-09-28 DIAGNOSIS — B351 Tinea unguium: Secondary | ICD-10-CM | POA: Diagnosis not present

## 2021-09-28 DIAGNOSIS — L84 Corns and callosities: Secondary | ICD-10-CM | POA: Diagnosis not present

## 2021-09-28 DIAGNOSIS — Z48815 Encounter for surgical aftercare following surgery on the digestive system: Secondary | ICD-10-CM | POA: Diagnosis not present

## 2021-09-28 DIAGNOSIS — M8580 Other specified disorders of bone density and structure, unspecified site: Secondary | ICD-10-CM | POA: Diagnosis not present

## 2021-09-28 DIAGNOSIS — Z9181 History of falling: Secondary | ICD-10-CM | POA: Diagnosis not present

## 2021-09-28 DIAGNOSIS — E039 Hypothyroidism, unspecified: Secondary | ICD-10-CM | POA: Diagnosis not present

## 2021-09-28 DIAGNOSIS — I700 Atherosclerosis of aorta: Secondary | ICD-10-CM | POA: Diagnosis not present

## 2021-09-28 DIAGNOSIS — Z7982 Long term (current) use of aspirin: Secondary | ICD-10-CM | POA: Diagnosis not present

## 2021-09-28 DIAGNOSIS — I10 Essential (primary) hypertension: Secondary | ICD-10-CM | POA: Diagnosis not present

## 2021-09-28 DIAGNOSIS — H9190 Unspecified hearing loss, unspecified ear: Secondary | ICD-10-CM | POA: Diagnosis not present

## 2021-09-28 DIAGNOSIS — I2510 Atherosclerotic heart disease of native coronary artery without angina pectoris: Secondary | ICD-10-CM | POA: Diagnosis not present

## 2021-09-28 DIAGNOSIS — K5730 Diverticulosis of large intestine without perforation or abscess without bleeding: Secondary | ICD-10-CM | POA: Diagnosis not present

## 2021-09-28 DIAGNOSIS — E7800 Pure hypercholesterolemia, unspecified: Secondary | ICD-10-CM | POA: Diagnosis not present

## 2021-09-28 DIAGNOSIS — K760 Fatty (change of) liver, not elsewhere classified: Secondary | ICD-10-CM | POA: Diagnosis not present

## 2021-09-28 NOTE — Telephone Encounter (Signed)
Christine with [facility] called requesting Verbal Orders for PT 2X4weeks and Nursing Evaluation.   Tried calling, LMOM to return call to give verbal orders. (Voicemail did not have a greeting with name of therapist for security, so I did not leave verbal on voicemail)

## 2021-09-28 NOTE — Telephone Encounter (Signed)
Transition Care Management Follow-up Telephone Call Date of discharge and from where: [facility] 09/26/2021 How have you been since you were released from the hospital? better Any questions or concerns? No  Items Reviewed: Did the pt receive and understand the discharge instructions provided? Yes  Medications obtained and verified? Yes  Other? No  Any new allergies since your discharge? No  Dietary orders reviewed? Yes Do you have support at home? Yes   Home Care and Equipment/Supplies: Were home health services ordered? not applicable If so, what is the name of the agency? N/a  Has the agency set up a time to come to the patient's home? no Were any new equipment or medical supplies ordered?  No What is the name of the medical supply agency? N/a Were you able to get the supplies/equipment? not applicable Do you have any questions related to the use of the equipment or supplies? No  Functional Questionnaire: (I = Independent and D = Dependent) ADLs: I  Bathing/Dressing- I  Meal Prep- I  Eating- I  Maintaining continence- I  Transferring/Ambulation- I  Managing Meds- I  Follow up appointments reviewed:  PCP Hospital f/u appt confirmed? Yes  Scheduled to see Windell Moulding, NP  on 10/06/2021 @ 1020AM. [facility] f/u appt confirmed? Yes  Scheduled to see general surgeon  on 10/20/2021 @ ?[facility] Are transportation arrangements needed? Yes  If their condition worsens, is the pt aware to call PCP or go to the Emergency Dept.? Yes Was the patient provided with contact information for the PCP's office or ED? Yes Was to pt encouraged to call back with questions or concerns? Yes

## 2021-09-29 ENCOUNTER — Encounter: Payer: Self-pay | Admitting: Orthopedic Surgery

## 2021-09-29 ENCOUNTER — Ambulatory Visit (INDEPENDENT_AMBULATORY_CARE_PROVIDER_SITE_OTHER): Payer: PPO | Admitting: Orthopedic Surgery

## 2021-09-29 VITALS — BP 146/82 | HR 96 | Temp 96.6°F | Ht 61.0 in | Wt 144.6 lb

## 2021-09-29 DIAGNOSIS — Z8719 Personal history of other diseases of the digestive system: Secondary | ICD-10-CM

## 2021-09-29 DIAGNOSIS — F329 Major depressive disorder, single episode, unspecified: Secondary | ICD-10-CM

## 2021-09-29 DIAGNOSIS — Z9889 Other specified postprocedural states: Secondary | ICD-10-CM | POA: Diagnosis not present

## 2021-09-29 DIAGNOSIS — R600 Localized edema: Secondary | ICD-10-CM

## 2021-09-29 MED ORDER — POTASSIUM CHLORIDE CRYS ER 10 MEQ PO TBCR: 10.0000 meq | EXTENDED_RELEASE_TABLET | Freq: Every day | ORAL | 0 refills | Status: DC | PRN

## 2021-09-29 MED ORDER — FUROSEMIDE 20 MG PO TABS: 20.0000 mg | ORAL_TABLET | Freq: Every day | ORAL | 0 refills | Status: DC | PRN

## 2021-09-29 NOTE — Progress Notes (Signed)
Careteam: Patient Care Team: Yvonna Alanis, NP as PCP - General (Adult Health Nurse Practitioner) [name], MD as Consulting Physician (Ophthalmology) Michael Boston, MD as Consulting Physician (General Surgery)  Seen by: Windell Moulding, AGNP-C  PLACE OF SERVICE:  [place] Directive information Does Patient Have a Medical Advance Directive?: No, Would patient like information on creating a medical advance directive?: No - Patient declined  Allergies  Allergen Reactions   Penicillins Shortness Of Breath and Rash    As a child   Terbinafine And Related     Headache, blurred vision, dizziness   Tetanus Toxoid Adsorbed Rash    Chief Complaint  Patient presents with   Hospitalization Follow-up    Transition of care.     HPI: Patient is a 76 y.o. female seen today s/p f/u recent hospitalization at [facility] 07/13- 07/17.   07/13 presented to the ED due to ongoing chest pain, N/V. CT revealed large fixed hiatal hernia. 07/14 she underwent robotic reduction of paraesophageal hiatal hernia, Type II mediastinal dissection, primary repair of hiatal hernia, anterior and posterior gastropexy, toupet fundoplication, mesh reinforcement with absorbable mesh. She tolerated procedure well. 07/17 discharged home with home health PT/OT.   Denies abdominal pain today. Increased pain to left shoulder. She has not taken any oxycodone today.   LBM 07/18. Described as normal.   Eating bland foods like yogurt, ice cream and ensure.   She reports increased BLE and slight sob. She believes it is fluid retention. She is using incentive spirometer. Her weight is up about 10 lbs. Denies productive cough or cold symptoms.   F/u with  Dr. Johney Maine 08/10.   Recent surgery made her feel lonely and depressed. Did not tolerate SSRI in past. She does not have any plans to harm self.   Review of Systems:  Review of Systems  Constitutional:  Negative for chills, fever, malaise/fatigue and  weight loss.  HENT:  Negative for congestion and sore throat.   Eyes:  Negative for blurred vision and double vision.  Respiratory:  Positive for shortness of breath. Negative for cough and wheezing.   Cardiovascular:  Positive for leg swelling. Negative for chest pain, palpitations and orthopnea.  Gastrointestinal:  Negative for abdominal pain, blood in stool, constipation, diarrhea, heartburn, nausea and vomiting.  Genitourinary:  Negative for dysuria, frequency and hematuria.  Musculoskeletal:  Negative for falls and joint pain.  Skin:  Negative for rash.       Surgical incision  Neurological:  Negative for dizziness, weakness and headaches.  Psychiatric/Behavioral:  Positive for depression. Negative for memory loss and suicidal ideas. The patient is not nervous/anxious and does not have insomnia.     Past Medical History:  Diagnosis Date   High cholesterol    History of bone density study    History of mammogram 2021   Hypertension    Hypothyroidism    left Toe pain 12/11/2020   Per patient   Past Surgical History:  Procedure Laterality Date   OTHER SURGICAL HISTORY     Cataracts Surgery by Dr.Hecker   XI ROBOTIC ASSISTED HIATAL HERNIA REPAIR N/A 09/23/2021   Procedure: XI ROBOTIC ASSISTED HIATAL HERNIA REPAIR TOUPET FUNDOPLICATION, MESH REINFORCEMENT;  Surgeon: Michael Boston, MD;  Location: [facility];  Service: General;  Laterality: N/A;   Social History:   reports that she has never smoked. She has never used smokeless tobacco. She reports that she does not drink alcohol and does not use drugs.  Family History  Problem Relation Age of Onset   Cancer Mother    Congestive Heart Failure Father     Medications: Patient's Medications  New Prescriptions   No medications on file  Previous Medications   ACETAMINOPHEN (TYLENOL) 500 MG TABLET    Take 500-1,000 mg by mouth daily as needed for mild pain. 1-2 tablets   ASPIRIN 81 MG CHEWABLE TABLET    Chew 81 mg by mouth daily.    CALCIUM CARBONATE-VIT D-MIN (CALCIUM 1200 PO)    Take 1 tablet by mouth daily.   GABAPENTIN (NEURONTIN) 100 MG CAPSULE    Take 1 capsule by mouth at bedtime   LEVOTHYROXINE (EUTHYROX) 75 MCG TABLET    Take 1 tablet (75 mcg total) by mouth daily.   LISINOPRIL-HYDROCHLOROTHIAZIDE (ZESTORETIC) 10-12.5 MG TABLET    Take 1 tablet by mouth once daily   MULTIPLE VITAMINS-MINERALS (MULTIVITAMIN GUMMIES ADULT PO)    Take 1 capsule by mouth daily in the afternoon.   ONDANSETRON (ZOFRAN) 4 MG TABLET    Take 1 tablet (4 mg total) by mouth every 8 (eight) hours as needed for nausea.   OXYCODONE (OXY IR/ROXICODONE) 5 MG IMMEDIATE RELEASE TABLET    Take 1 tablet (5 mg total) by mouth every 6 (six) hours as needed for moderate pain, severe pain or breakthrough pain.   PANTOPRAZOLE (PROTONIX) 40 MG TABLET    Take 1 tablet (40 mg total) by mouth daily.   PFIZER COVID-19 VAC BIVALENT INJECTION       SIMVASTATIN (ZOCOR) 20 MG TABLET    TAKE 1 TABLET BY MOUTH ONCE DAILY AT 6 PM   TURMERIC 500 MG CAPS    Take 500 mg by mouth daily.  Modified Medications   No medications on file  Discontinued Medications   No medications on file    Physical Exam:  Vitals:   09/29/21 0908  Height: '5\' 1"'$  (1.549 m)   Body mass index is 26.24 kg/m. Wt Readings from Last 3 Encounters:  09/22/21 138 lb 14.2 oz (63 kg)  05/05/21 137 lb 12.8 oz (62.5 kg)  10/28/20 138 lb 6.4 oz (62.8 kg)    Physical Exam Vitals reviewed.  Constitutional:      General: She is not in acute distress. HENT:     Head: Normocephalic.  Eyes:     General:        Right eye: No discharge.        Left eye: No discharge.  Cardiovascular:     Rate and Rhythm: Normal rate and regular rhythm.     Pulses: Normal pulses.     Heart sounds: Normal heart sounds.  Pulmonary:     Effort: Pulmonary effort is normal. No respiratory distress.     Breath sounds: Normal breath sounds. No wheezing or rales.  Abdominal:     General: Bowel sounds are normal.  There is no distension.     Palpations: Abdomen is soft.     Tenderness: There is no abdominal tenderness.  Musculoskeletal:     Cervical back: Neck supple.     Right lower leg: Edema present.     Left lower leg: Edema present.     Comments: 1+ pitting  Skin:    General: Skin is warm and dry.     Capillary Refill: Capillary refill takes less than 2 seconds.     Comments: Surgical lap sites x 4 CDI, mild bruising near umbilicus, no drianage  Neurological:     General: No focal  deficit present.     Mental Status: She is alert and oriented to person, place, and time.  Psychiatric:        Mood and Affect: Mood normal.        Behavior: Behavior normal.     Labs reviewed: Basic Metabolic Panel: Recent Labs    10/21/20 0829 05/03/21 0829 09/22/21 0919 09/23/21 0455  NA  --  137 143 142  K  --  5.1 3.3* 3.7  CL  --  100 103 103  CO2  --  '30 29 30  '$ GLUCOSE  --  80 131* 118*  BUN  --  25 29* 28*  CREATININE  --  0.85 0.83 0.82  CALCIUM  --  9.5 8.9 8.7*  MG  --   --   --  2.2  TSH 1.30 2.91  --   --    Liver Function Tests: Recent Labs    05/03/21 0829 09/22/21 0919  AST 20 31  ALT 18 22  ALKPHOS  --  66  BILITOT 0.6 0.7  PROT 6.8 7.2  ALBUMIN  --  4.1   Recent Labs    09/22/21 0919  LIPASE 40   No results for input(s): "AMMONIA" in the last 8760 hours. CBC: Recent Labs    09/22/21 0837 09/23/21 0455 09/24/21 0453  WBC 9.6 13.7* 11.9*  NEUTROABS 7.5  --   --   HGB 16.6* 14.1 12.7  HCT 47.9* 42.4 39.4  MCV 92.8 94.9 97.5  PLT 262 211 170   Lipid Panel: Recent Labs    05/03/21 0829  CHOL 155  HDL 61  LDLCALC 72  TRIG 132  CHOLHDL 2.5   TSH: Recent Labs    10/21/20 0829 05/03/21 0829  TSH 1.30 2.91   A1C: No results found for: "HGBA1C"   Assessment/Plan 1. Bilateral leg edema - 1+ pitting edema, about 10 lbs weight gain, mild sob - start furosemide x 2 days for edema - furosemide (LASIX) 20 MG tablet; Take 1 tablet (20 mg total) by  mouth daily as needed for edema.  Dispense: 15 tablet; Refill: 0 - potassium chloride (KLOR-CON M) 10 MEQ tablet; Take 1 tablet (10 mEq total) by mouth daily as needed.  Dispense: 15 tablet; Refill: 0  2. S/P hernia repair - followed by Dr. Johney Maine- f/u 08/10 - surgical sites no sign of infection - may wean to tylenol 1000 mg TID PRN when ready - LBM 07/18 - cont soft diet - cont incentive spirometer - may splint with pillow when sneezing/coughing - recommend light walking to help with shoulder pain  3. Reactive depression - ongoing - husband passed away in past year - she does not have many support systems - unsuccessful trial of SSRI  Total time: 31 minutes. Greater than 50% of total time spent doing patient education regarding post op instructions, edema, and symptom/medication management.      Next appt:10/13/2021  [place], [facility] [phone]

## 2021-09-29 NOTE — Patient Instructions (Addendum)
Please take one furosemide (lasix) and potassium pill every morning x 2 days  If your legs become swollen or short of breath- you make repeat furosemide and potassium pill every morning x 2 days  Please contact provider if symptoms do not improve  Walk to improve gas pain/ left shoulder pain  Incentive spirometer or light coughing to prevent pneumonia  May use pillow over belly to splint when coughing/ sneezing  May transition to tylenol 1000 mg (2 extra strength) 3x/day as needed

## 2021-10-03 ENCOUNTER — Other Ambulatory Visit: Payer: Self-pay | Admitting: Orthopedic Surgery

## 2021-10-03 DIAGNOSIS — Z0000 Encounter for general adult medical examination without abnormal findings: Secondary | ICD-10-CM

## 2021-10-03 NOTE — Telephone Encounter (Signed)
Not able to reach patient

## 2021-10-06 ENCOUNTER — Encounter: Payer: PPO | Admitting: Orthopedic Surgery

## 2021-10-11 DIAGNOSIS — I700 Atherosclerosis of aorta: Secondary | ICD-10-CM | POA: Diagnosis not present

## 2021-10-11 DIAGNOSIS — I2510 Atherosclerotic heart disease of native coronary artery without angina pectoris: Secondary | ICD-10-CM | POA: Diagnosis not present

## 2021-10-11 DIAGNOSIS — M8580 Other specified disorders of bone density and structure, unspecified site: Secondary | ICD-10-CM | POA: Diagnosis not present

## 2021-10-11 DIAGNOSIS — B351 Tinea unguium: Secondary | ICD-10-CM | POA: Diagnosis not present

## 2021-10-11 DIAGNOSIS — Z9181 History of falling: Secondary | ICD-10-CM | POA: Diagnosis not present

## 2021-10-11 DIAGNOSIS — Z7982 Long term (current) use of aspirin: Secondary | ICD-10-CM | POA: Diagnosis not present

## 2021-10-11 DIAGNOSIS — E039 Hypothyroidism, unspecified: Secondary | ICD-10-CM | POA: Diagnosis not present

## 2021-10-11 DIAGNOSIS — K760 Fatty (change of) liver, not elsewhere classified: Secondary | ICD-10-CM | POA: Diagnosis not present

## 2021-10-11 DIAGNOSIS — I10 Essential (primary) hypertension: Secondary | ICD-10-CM | POA: Diagnosis not present

## 2021-10-11 DIAGNOSIS — K5730 Diverticulosis of large intestine without perforation or abscess without bleeding: Secondary | ICD-10-CM | POA: Diagnosis not present

## 2021-10-11 DIAGNOSIS — Z48815 Encounter for surgical aftercare following surgery on the digestive system: Secondary | ICD-10-CM | POA: Diagnosis not present

## 2021-10-11 DIAGNOSIS — E7800 Pure hypercholesterolemia, unspecified: Secondary | ICD-10-CM | POA: Diagnosis not present

## 2021-10-11 DIAGNOSIS — L84 Corns and callosities: Secondary | ICD-10-CM | POA: Diagnosis not present

## 2021-10-11 DIAGNOSIS — H9190 Unspecified hearing loss, unspecified ear: Secondary | ICD-10-CM | POA: Diagnosis not present

## 2021-10-13 ENCOUNTER — Encounter: Payer: PPO | Admitting: Orthopedic Surgery

## 2021-10-27 ENCOUNTER — Other Ambulatory Visit: Payer: Self-pay | Admitting: Orthopedic Surgery

## 2021-10-27 DIAGNOSIS — E039 Hypothyroidism, unspecified: Secondary | ICD-10-CM

## 2021-11-02 ENCOUNTER — Other Ambulatory Visit: Payer: Self-pay | Admitting: Orthopedic Surgery

## 2021-11-02 DIAGNOSIS — E7800 Pure hypercholesterolemia, unspecified: Secondary | ICD-10-CM

## 2021-11-03 ENCOUNTER — Ambulatory Visit: Payer: PPO | Admitting: Orthopedic Surgery

## 2021-11-28 ENCOUNTER — Ambulatory Visit: Payer: PPO | Admitting: Podiatry

## 2021-11-28 ENCOUNTER — Encounter: Payer: Self-pay | Admitting: Podiatry

## 2021-11-28 DIAGNOSIS — L84 Corns and callosities: Secondary | ICD-10-CM

## 2021-11-28 DIAGNOSIS — M2042 Other hammer toe(s) (acquired), left foot: Secondary | ICD-10-CM

## 2021-11-28 DIAGNOSIS — M79675 Pain in left toe(s): Secondary | ICD-10-CM

## 2021-11-28 DIAGNOSIS — M79674 Pain in right toe(s): Secondary | ICD-10-CM

## 2021-11-28 DIAGNOSIS — B351 Tinea unguium: Secondary | ICD-10-CM

## 2021-11-28 DIAGNOSIS — M2012 Hallux valgus (acquired), left foot: Secondary | ICD-10-CM

## 2021-11-28 NOTE — Progress Notes (Signed)
This patient presents to the office with chief complaint of long thick painful nails.  Patient says the nails are painful walking and wearing shoes.  This patient is unable to self treat.  This patient is unable to trim her nails since she is unable to reach her nails.  She presents to the office for preventative foot care services.  General Appearance  Alert, conversant and in no acute stress.  Vascular  Dorsalis pedis and posterior tibial  pulses are palpable  bilaterally.  Capillary return is within normal limits  bilaterally. Temperature is within normal limits  bilaterally.  Neurologic  Senn-Weinstein monofilament wire test within normal limits  bilaterally. Muscle power within normal limits bilaterally.  Nails Thick disfigured discolored nails with subungual debris  from hallux to fifth toes bilaterally. No evidence of bacterial infection or drainage bilaterally.  Orthopedic  No limitations of motion  feet .  No crepitus or effusions noted. 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  Skin  normotropic skin with no porokeratosis noted bilaterally.  No signs of infections or ulcers noted.   Callus 1/2 left foot.  Asymptomatic callus left heel.  Onychomycosis  Nails  B/L.  Pain in right toes  Pain in left toes  Callus left foot.  Debridement of nails both feet with nail nipper  followed by trimming the nails with dremel tool. Debride callus with # 15 blade followed by dremel tool usage.   RTC 3 months.   Gardiner Barefoot DPM

## 2022-01-02 LAB — HM MAMMOGRAPHY

## 2022-01-05 DIAGNOSIS — Z1231 Encounter for screening mammogram for malignant neoplasm of breast: Secondary | ICD-10-CM | POA: Diagnosis not present

## 2022-01-05 LAB — HM MAMMOGRAPHY

## 2022-02-27 ENCOUNTER — Encounter: Payer: Self-pay | Admitting: Podiatry

## 2022-02-27 ENCOUNTER — Ambulatory Visit: Payer: PPO | Admitting: Podiatry

## 2022-02-27 DIAGNOSIS — B351 Tinea unguium: Secondary | ICD-10-CM | POA: Diagnosis not present

## 2022-02-27 DIAGNOSIS — M79674 Pain in right toe(s): Secondary | ICD-10-CM | POA: Diagnosis not present

## 2022-02-27 DIAGNOSIS — M2042 Other hammer toe(s) (acquired), left foot: Secondary | ICD-10-CM

## 2022-02-27 DIAGNOSIS — M79675 Pain in left toe(s): Secondary | ICD-10-CM | POA: Diagnosis not present

## 2022-02-27 DIAGNOSIS — M2012 Hallux valgus (acquired), left foot: Secondary | ICD-10-CM

## 2022-02-27 NOTE — Progress Notes (Signed)
This patient presents to the office with chief complaint of long thick painful nails.  Patient says the nails are painful walking and wearing shoes.  This patient is unable to self treat.  This patient is unable to trim her nails since she is unable to reach her nails.  She presents to the office for preventative foot care services.  General Appearance  Alert, conversant and in no acute stress.  Vascular  Dorsalis pedis and posterior tibial  pulses are palpable  bilaterally.  Capillary return is within normal limits  bilaterally. Temperature is within normal limits  bilaterally.  Neurologic  Senn-Weinstein monofilament wire test within normal limits  bilaterally. Muscle power within normal limits bilaterally.  Nails Thick disfigured discolored nails with subungual debris  from hallux to fifth toes bilaterally. No evidence of bacterial infection or drainage bilaterally.  Orthopedic  No limitations of motion  feet .  No crepitus or effusions noted. 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  Skin  normotropic skin with no porokeratosis noted bilaterally.  No signs of infections or ulcers noted.   Callus 1/2 left foot.  Asymptomatic callus left heel.  Onychomycosis  Nails  B/L.  Pain in right toes  Pain in left toes  Callus left foot.  Debridement of nails both feet with nail nipper  followed by trimming the nails with dremel tool. Debride callus with dremel tool.   RTC 3 months.   Gardiner Barefoot DPM

## 2022-03-09 ENCOUNTER — Other Ambulatory Visit: Payer: Self-pay | Admitting: Orthopedic Surgery

## 2022-03-09 DIAGNOSIS — I10 Essential (primary) hypertension: Secondary | ICD-10-CM

## 2022-03-24 ENCOUNTER — Other Ambulatory Visit: Payer: Self-pay | Admitting: Orthopedic Surgery

## 2022-03-24 DIAGNOSIS — E7800 Pure hypercholesterolemia, unspecified: Secondary | ICD-10-CM

## 2022-04-12 NOTE — Patient Instructions (Signed)
Try food diary> look for trends in what you eat that may cause diarrhea  May take imodium when you have diarrhea> do not take a lot> can cause constipation  Eat a apple daily to help with fiber intake  Try metamucil capsules> generic psyllium capsules> try one dose every other day    If none of these interventions work> call [facility] [phone]  Fasting labs tomorrow> you can only have water or black coffee  Schedule medicare annual wellness visit within next 6 months

## 2022-04-13 ENCOUNTER — Ambulatory Visit (INDEPENDENT_AMBULATORY_CARE_PROVIDER_SITE_OTHER): Payer: PPO | Admitting: Orthopedic Surgery

## 2022-04-13 ENCOUNTER — Encounter: Payer: Self-pay | Admitting: Orthopedic Surgery

## 2022-04-13 VITALS — BP 120/80 | HR 69 | Temp 95.5°F | Resp 18 | Ht 61.0 in | Wt 131.4 lb

## 2022-04-13 DIAGNOSIS — E7800 Pure hypercholesterolemia, unspecified: Secondary | ICD-10-CM

## 2022-04-13 DIAGNOSIS — R197 Diarrhea, unspecified: Secondary | ICD-10-CM | POA: Diagnosis not present

## 2022-04-13 DIAGNOSIS — E039 Hypothyroidism, unspecified: Secondary | ICD-10-CM

## 2022-04-13 DIAGNOSIS — Z8719 Personal history of other diseases of the digestive system: Secondary | ICD-10-CM

## 2022-04-13 DIAGNOSIS — R5383 Other fatigue: Secondary | ICD-10-CM

## 2022-04-13 DIAGNOSIS — I10 Essential (primary) hypertension: Secondary | ICD-10-CM | POA: Diagnosis not present

## 2022-04-13 DIAGNOSIS — Z9889 Other specified postprocedural states: Secondary | ICD-10-CM

## 2022-04-13 NOTE — Progress Notes (Signed)
Careteam: Patient Care Team: Yvonna Alanis, NP as PCP - General (Adult Health Nurse Practitioner) [name], MD as Consulting Physician (Ophthalmology) Michael Boston, MD as Consulting Physician (General Surgery)  Seen by: Windell Moulding, AGNP-C  PLACE OF SERVICE:  [place]  Advanced Directive information    Allergies  Allergen Reactions   Penicillins Shortness Of Breath and Rash    As a child   Terbinafine And Related     Headache, blurred vision, dizziness   Tetanus Toxoid Adsorbed Rash    Chief Complaint  Patient presents with   Follow-up    Eight month Follow-up   Quality Metric Gaps    Needs to Discuss  DTaP/Tdap/Td (1 - Tdap) Pneumonia Vaccine 16+ Years old (1 - PCV) Medicare Annual Wellness (AWV) INFLUENZA VACCINE      HPI: Patient is a 77 y.o. female seen today for medical management of chronic conditions.   Reports intermittent diarrhea 1-2 episodes weekly. She will have abdominal pain during these episodes. She reports keeping a food diary, but did not bring with her today. Food often consumed are eggs, toast, bacon, biscuits, jimmy dean frozen muffins, fresh fruit, soup, sandwiches, and salads. She does not eat fast food. If she goes to restaurant she will eat soup or salad. Admits to eating asian foods too.  S/p hernia repair 09/2021. Followed by [facility], she was advised to follow low fat high fiber diet. She did this for awhile, but then switched to regular foods. She also took metamucil for awhile but stopped. She does take Align probiotic daily.  She also reports intermittent vomiting, averaging 1x/week. She believes symptoms related to eating too much food or associated with swallowing certain breads or liquids. She has noticed after surgery, some foods make her sick or she does not like them anymore.   Reports increased fatigue. H/o hypothyroidism. Reports taking levothyroxine on empty stomach. Attends exercise classes 4x/week. Each class 50 minutes.   BP  stable.   Weight down 13 lbs in 6 months.   Discussed scheduling medicare annual wellness visit in near future.     Review of Systems:  Review of Systems  Constitutional:  Positive for malaise/fatigue and weight loss. Negative for chills and fever.  HENT:  Negative for congestion and sore throat.   Eyes:  Negative for blurred vision and double vision.  Respiratory:  Negative for cough, shortness of breath and wheezing.   Cardiovascular:  Negative for chest pain and leg swelling.  Gastrointestinal:  Positive for abdominal pain, diarrhea and vomiting. Negative for blood in stool, constipation, heartburn, melena and nausea.  Genitourinary:  Negative for dysuria.  Musculoskeletal:  Negative for falls, joint pain and myalgias.  Skin:  Negative for rash.  Neurological:  Negative for dizziness, weakness and headaches.  Psychiatric/Behavioral:  Negative for depression and memory loss. The patient is not nervous/anxious and does not have insomnia.     Past Medical History:  Diagnosis Date   High cholesterol    History of bone density study    History of mammogram 2021   Hypertension    Hypothyroidism    left Toe pain 12/11/2020   Per patient   Past Surgical History:  Procedure Laterality Date   OTHER SURGICAL HISTORY     Cataracts Surgery by Dr.Hecker   XI ROBOTIC ASSISTED HIATAL HERNIA REPAIR N/A 09/23/2021   Procedure: XI ROBOTIC ASSISTED HIATAL HERNIA REPAIR TOUPET FUNDOPLICATION, MESH REINFORCEMENT;  Surgeon: Michael Boston, MD;  Location: [facility];  Service: General;  Laterality: N/A;   Social History:   reports that she has never smoked. She has never used smokeless tobacco. She reports that she does not drink alcohol and does not use drugs.  Family History  Problem Relation Age of Onset   Cancer Mother    Congestive Heart Failure Father     Medications: Patient's Medications  New Prescriptions   No medications on file  Previous Medications   ACETAMINOPHEN (TYLENOL) 500  MG TABLET    Take 500-1,000 mg by mouth daily as needed for mild pain. 1-2 tablets   ASPIRIN 81 MG CHEWABLE TABLET    Chew 81 mg by mouth daily.   CALCIUM CARBONATE-VIT D-MIN (CALCIUM 1200 PO)    Take 1 tablet by mouth daily.   GABAPENTIN (NEURONTIN) 100 MG CAPSULE    Take 1 capsule by mouth at bedtime   LEVOTHYROXINE (SYNTHROID) 75 MCG TABLET    Take 1 tablet by mouth once daily   LISINOPRIL-HYDROCHLOROTHIAZIDE (ZESTORETIC) 10-12.5 MG TABLET    Take 1 tablet by mouth once daily   MULTIPLE VITAMINS-MINERALS (MULTIVITAMIN GUMMIES ADULT PO)    Take 1 capsule by mouth daily in the afternoon.   ONDANSETRON (ZOFRAN) 4 MG TABLET    Take 1 tablet (4 mg total) by mouth every 8 (eight) hours as needed for nausea.   PANTOPRAZOLE (PROTONIX) 40 MG TABLET    Take 1 tablet (40 mg total) by mouth daily.   PFIZER COVID-19 VAC BIVALENT INJECTION       POTASSIUM CHLORIDE (KLOR-CON M) 10 MEQ TABLET    Take 1 tablet (10 mEq total) by mouth daily as needed.   SIMVASTATIN (ZOCOR) 20 MG TABLET    TAKE 1 TABLET BY MOUTH ONCE DAILY AT  6PM   TURMERIC 500 MG CAPS    Take 500 mg by mouth daily.  Modified Medications   No medications on file  Discontinued Medications   FUROSEMIDE (LASIX) 20 MG TABLET    Take 1 tablet (20 mg total) by mouth daily as needed for edema.   OXYCODONE (OXY IR/ROXICODONE) 5 MG IMMEDIATE RELEASE TABLET    Take 1 tablet (5 mg total) by mouth every 6 (six) hours as needed for moderate pain, severe pain or breakthrough pain.    Physical Exam:  There were no vitals filed for this visit. There is no height or weight on file to calculate BMI. Wt Readings from Last 3 Encounters:  09/29/21 144 lb 9.6 oz (65.6 kg)  09/22/21 138 lb 14.2 oz (63 kg)  05/05/21 137 lb 12.8 oz (62.5 kg)    Physical Exam Vitals reviewed.  Constitutional:      General: She is not in acute distress. HENT:     Head: Normocephalic.  Eyes:     General:        Right eye: No discharge.        Left eye: No discharge.   Cardiovascular:     Rate and Rhythm: Normal rate and regular rhythm.     Pulses: Normal pulses.     Heart sounds: Normal heart sounds.  Pulmonary:     Effort: Pulmonary effort is normal. No respiratory distress.     Breath sounds: Normal breath sounds. No wheezing.  Abdominal:     General: Bowel sounds are normal. There is no distension.     Palpations: Abdomen is soft. There is no mass.     Tenderness: There is no abdominal tenderness. There is no guarding or rebound.     Hernia:  No hernia is present.  Musculoskeletal:     Cervical back: Neck supple.     Right lower leg: No edema.     Left lower leg: No edema.  Skin:    General: Skin is warm and dry.     Capillary Refill: Capillary refill takes less than 2 seconds.  Neurological:     General: No focal deficit present.     Mental Status: She is alert and oriented to person, place, and time.  Psychiatric:        Mood and Affect: Mood normal.        Behavior: Behavior normal.     Labs reviewed: Basic Metabolic Panel: Recent Labs    05/03/21 0829 09/22/21 0919 09/23/21 0455  NA 137 143 142  K 5.1 3.3* 3.7  CL 100 103 103  CO2 '30 29 30  '$ GLUCOSE 80 131* 118*  BUN 25 29* 28*  CREATININE 0.85 0.83 0.82  CALCIUM 9.5 8.9 8.7*  MG  --   --  2.2  TSH 2.91  --   --    Liver Function Tests: Recent Labs    05/03/21 0829 09/22/21 0919  AST 20 31  ALT 18 22  ALKPHOS  --  66  BILITOT 0.6 0.7  PROT 6.8 7.2  ALBUMIN  --  4.1   Recent Labs    09/22/21 0919  LIPASE 40   No results for input(s): "AMMONIA" in the last 8760 hours. CBC: Recent Labs    09/22/21 0837 09/23/21 0455 09/24/21 0453  WBC 9.6 13.7* 11.9*  NEUTROABS 7.5  --   --   HGB 16.6* 14.1 12.7  HCT 47.9* 42.4 39.4  MCV 92.8 94.9 97.5  PLT 262 211 170   Lipid Panel: Recent Labs    05/03/21 0829  CHOL 155  HDL 61  LDLCALC 72  TRIG 132  CHOLHDL 2.5   TSH: Recent Labs    05/03/21 0829  TSH 2.91   A1C: No results found for:  "HGBA1C"   Assessment/Plan: 1. Fatigue, unspecified type - h/o hypothyroidism, appears to be taking medication correctly - CBC with Differential/Platelet; Future - TSH; Future  2. Diarrhea, unspecified type - occurring 1-2x/week - recommend food diary - recommend increasing fiber in diet> may try metamucil again - recommend imodium prn when episodes occur - consider KUB, stool pcr in future if symptoms persist - Complete Metabolic Panel with eGFR; Future  3. Essential hypertension - controlled - cont lisinopril-HCTZ  4. Pure hypercholesterolemia - LDL 72 04/2021 - cont simvastatin - Lipid Panel; Future  5. Acquired hypothyroidism - cont levothyroxine - TSH; Future  6. S/P hernia repair - followed by Dr Johney Maine with [facility]> f/u now prn - intermittent diarrhea, abdominal pain, vomiting - exam unremarkable - afebrile - suspect diet related  - see above  Total time: 35 minutes. Greater than 50% of total time spent doing patient education regarding health maintenance, abdominal pain, diarrhea, HTN, HLD, hypothyroidism including symptom/medication management.    Next appt: Visit date not found  [place], [facility] [phone]

## 2022-04-14 ENCOUNTER — Other Ambulatory Visit: Payer: PPO

## 2022-04-14 DIAGNOSIS — E7800 Pure hypercholesterolemia, unspecified: Secondary | ICD-10-CM

## 2022-04-14 DIAGNOSIS — R5383 Other fatigue: Secondary | ICD-10-CM

## 2022-04-14 DIAGNOSIS — R197 Diarrhea, unspecified: Secondary | ICD-10-CM

## 2022-04-14 DIAGNOSIS — E039 Hypothyroidism, unspecified: Secondary | ICD-10-CM | POA: Diagnosis not present

## 2022-04-15 LAB — LIPID PANEL
Cholesterol: 154 mg/dL (ref <200)
HDL: 68 mg/dL (ref >=50)
LDL Cholesterol (Calc): 64 mg/dL (calc)
Non-HDL Cholesterol (Calc): 86 mg/dL (calc) (ref <130)
Total CHOL/HDL Ratio: 2.3 (calc) (ref <5.0)
Triglycerides: 141 mg/dL (ref <150)

## 2022-04-15 LAB — CBC WITH DIFFERENTIAL/PLATELET
Absolute Monocytes: 474 cells/uL (ref 200–950)
Basophils Absolute: 51 cells/uL (ref 0–200)
Basophils Relative: 1 %
Eosinophils Absolute: 168 cells/uL (ref 15–500)
Eosinophils Relative: 3.3 %
HCT: 42.7 % (ref 35.0–45.0)
Hemoglobin: 14.6 g/dL (ref 11.7–15.5)
Lymphs Abs: 1698 cells/uL (ref 850–3900)
MCH: 32.1 pg (ref 27.0–33.0)
MCHC: 34.2 g/dL (ref 32.0–36.0)
MCV: 93.8 fL (ref 80.0–100.0)
MPV: 10.8 fL (ref 7.5–12.5)
Monocytes Relative: 9.3 %
Neutro Abs: 2708 cells/uL (ref 1500–7800)
Neutrophils Relative %: 53.1 %
Platelets: 204 10*3/uL (ref 140–400)
RBC: 4.55 10*6/uL (ref 3.80–5.10)
RDW: 13 % (ref 11.0–15.0)
Total Lymphocyte: 33.3 %
WBC: 5.1 10*3/uL (ref 3.8–10.8)

## 2022-04-15 LAB — COMPLETE METABOLIC PANEL WITH GFR
AG Ratio: 1.6 (calc) (ref 1.0–2.5)
ALT: 15 U/L (ref 6–29)
AST: 19 U/L (ref 10–35)
Albumin: 4.2 g/dL (ref 3.6–5.1)
Alkaline phosphatase (APISO): 68 U/L (ref 37–153)
BUN/Creatinine Ratio: 33 (calc) — ABNORMAL HIGH (ref 6–22)
BUN: 27 mg/dL — ABNORMAL HIGH (ref 7–25)
CO2: 30 mmol/L (ref 20–32)
Calcium: 9.7 mg/dL (ref 8.6–10.4)
Chloride: 100 mmol/L (ref 98–110)
Creat: 0.82 mg/dL (ref 0.60–1.00)
Globulin: 2.7 g/dL (calc) (ref 1.9–3.7)
Glucose, Bld: 88 mg/dL (ref 65–99)
Potassium: 4.5 mmol/L (ref 3.5–5.3)
Sodium: 138 mmol/L (ref 135–146)
Total Bilirubin: 0.5 mg/dL (ref 0.2–1.2)
Total Protein: 6.9 g/dL (ref 6.1–8.1)
eGFR: 74 mL/min/{1.73_m2} (ref >=60)

## 2022-04-15 LAB — TSH: TSH: 5.43 mIU/L — ABNORMAL HIGH (ref 0.40–4.50)

## 2022-04-16 ENCOUNTER — Other Ambulatory Visit: Payer: Self-pay | Admitting: Orthopedic Surgery

## 2022-04-16 DIAGNOSIS — E039 Hypothyroidism, unspecified: Secondary | ICD-10-CM

## 2022-04-16 MED ORDER — LEVOTHYROXINE SODIUM 88 MCG PO TABS: 88.0000 ug | ORAL_TABLET | Freq: Every day | ORAL | 3 refills | Status: DC

## 2022-05-08 ENCOUNTER — Ambulatory Visit: Payer: PPO | Admitting: Podiatry

## 2022-05-19 ENCOUNTER — Other Ambulatory Visit: Payer: Self-pay

## 2022-05-19 DIAGNOSIS — E039 Hypothyroidism, unspecified: Secondary | ICD-10-CM

## 2022-05-29 ENCOUNTER — Ambulatory Visit: Payer: PPO | Admitting: Podiatry

## 2022-05-29 ENCOUNTER — Encounter: Payer: Self-pay | Admitting: Podiatry

## 2022-05-29 DIAGNOSIS — M79674 Pain in right toe(s): Secondary | ICD-10-CM | POA: Diagnosis not present

## 2022-05-29 DIAGNOSIS — M79675 Pain in left toe(s): Secondary | ICD-10-CM | POA: Diagnosis not present

## 2022-05-29 DIAGNOSIS — L84 Corns and callosities: Secondary | ICD-10-CM | POA: Diagnosis not present

## 2022-05-29 DIAGNOSIS — B351 Tinea unguium: Secondary | ICD-10-CM

## 2022-05-29 DIAGNOSIS — M2012 Hallux valgus (acquired), left foot: Secondary | ICD-10-CM

## 2022-05-29 NOTE — Progress Notes (Signed)
This patient presents to the office with chief complaint of long thick painful nails.  Patient says the nails are painful walking and wearing shoes.  This patient is unable to self treat.  This patient is unable to trim her nails since she is unable to reach her nails.  She presents to the office for preventative foot care services.  General Appearance  Alert, conversant and in no acute stress.  Vascular  Dorsalis pedis and posterior tibial  pulses are palpable  bilaterally.  Capillary return is within normal limits  bilaterally. Temperature is within normal limits  bilaterally.  Neurologic  Senn-Weinstein monofilament wire test within normal limits  bilaterally. Muscle power within normal limits bilaterally.  Nails Thick disfigured discolored nails with subungual debris  from hallux to fifth toes bilaterally. No evidence of bacterial infection or drainage bilaterally.  Orthopedic  No limitations of motion  feet .  No crepitus or effusions noted. 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  Skin  normotropic skin with no porokeratosis noted bilaterally.  No signs of infections or ulcers noted.   Callus 1/2 B/L. Asymptomatic callus left heel.  Onychomycosis  Nails  B/L.  Pain in right toes  Pain in left toes  Callus B/L.  Debridement of nails both feet with nail nipper  followed by trimming the nails with dremel tool. Debride callus with dremel tool between 1/2  B/L. RTC 3 months.   Gardiner Barefoot DPM

## 2022-05-31 ENCOUNTER — Other Ambulatory Visit: Payer: Self-pay | Admitting: Orthopedic Surgery

## 2022-05-31 DIAGNOSIS — M79675 Pain in left toe(s): Secondary | ICD-10-CM

## 2022-06-08 ENCOUNTER — Other Ambulatory Visit: Payer: PPO

## 2022-06-08 DIAGNOSIS — E039 Hypothyroidism, unspecified: Secondary | ICD-10-CM | POA: Diagnosis not present

## 2022-06-08 LAB — TSH: TSH: 1.66 mIU/L (ref 0.40–4.50)

## 2022-07-01 ENCOUNTER — Other Ambulatory Visit: Payer: Self-pay | Admitting: Orthopedic Surgery

## 2022-07-01 DIAGNOSIS — E7800 Pure hypercholesterolemia, unspecified: Secondary | ICD-10-CM

## 2022-07-03 ENCOUNTER — Telehealth: Payer: Self-pay | Admitting: Pharmacist

## 2022-07-03 NOTE — Progress Notes (Signed)
Patient appearing on report for quality metrics.  Outreached patient to discuss medication management. Left voicemail for patient to return my call at their convenience.   Delani Kohli, PharmD, BCPS Clinical Pharmacist [facility]  

## 2022-08-18 ENCOUNTER — Encounter: Payer: Self-pay | Admitting: Podiatry

## 2022-08-18 ENCOUNTER — Telehealth: Payer: Self-pay | Admitting: Podiatry

## 2022-08-18 NOTE — Telephone Encounter (Signed)
Left 2nd message for pt to call to r/s appt for Dr Stacie Acres on 6.17 he will be out of office in the afternoon. Message stated appt was cxled.  Also mailed letter to pt.

## 2022-08-27 ENCOUNTER — Other Ambulatory Visit: Payer: Self-pay | Admitting: Orthopedic Surgery

## 2022-08-27 DIAGNOSIS — I10 Essential (primary) hypertension: Secondary | ICD-10-CM

## 2022-08-28 ENCOUNTER — Ambulatory Visit: Payer: PPO | Admitting: Podiatry

## 2022-08-29 ENCOUNTER — Ambulatory Visit: Payer: PPO | Admitting: Podiatry

## 2022-08-29 DIAGNOSIS — M2012 Hallux valgus (acquired), left foot: Secondary | ICD-10-CM | POA: Diagnosis not present

## 2022-08-29 DIAGNOSIS — M79675 Pain in left toe(s): Secondary | ICD-10-CM

## 2022-08-29 DIAGNOSIS — M2042 Other hammer toe(s) (acquired), left foot: Secondary | ICD-10-CM

## 2022-08-29 DIAGNOSIS — B351 Tinea unguium: Secondary | ICD-10-CM

## 2022-08-29 DIAGNOSIS — M79674 Pain in right toe(s): Secondary | ICD-10-CM | POA: Diagnosis not present

## 2022-08-29 DIAGNOSIS — L84 Corns and callosities: Secondary | ICD-10-CM

## 2022-08-29 NOTE — Progress Notes (Signed)
This patient presents to the office with chief complaint of long thick painful nails.  Patient says the nails are painful walking and wearing shoes.  This patient is unable to self treat.  This patient is unable to trim her nails since she is unable to reach her nails.  She presents to the office for preventative foot care services.  General Appearance  Alert, conversant and in no acute stress.  Vascular  Dorsalis pedis and posterior tibial  pulses are palpable  bilaterally.  Capillary return is within normal limits  bilaterally. Temperature is within normal limits  bilaterally.  Neurologic  Senn-Weinstein monofilament wire test within normal limits  bilaterally. Muscle power within normal limits bilaterally.  Nails Thick disfigured discolored nails with subungual debris  from hallux to fifth toes bilaterally. No evidence of bacterial infection or drainage bilaterally.  Orthopedic  No limitations of motion  feet .  No crepitus or effusions noted. 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  Skin  normotropic skin with no porokeratosis noted bilaterally.  No signs of infections or ulcers noted.   Callus 1/2 B/L. Asymptomatic. Asymptomatic callus left heel.  Onychomycosis  Nails  B/L.  Pain in right toes  Pain in left toes    Debridement of nails both feet with nail nipper  followed by trimming the nails with dremel tool. RTC 3 months.   Helane Gunther DPM

## 2022-09-15 ENCOUNTER — Other Ambulatory Visit: Payer: Self-pay | Admitting: Orthopedic Surgery

## 2022-09-15 DIAGNOSIS — E7800 Pure hypercholesterolemia, unspecified: Secondary | ICD-10-CM

## 2022-11-25 ENCOUNTER — Other Ambulatory Visit: Payer: Self-pay | Admitting: Orthopedic Surgery

## 2022-11-25 DIAGNOSIS — M79675 Pain in left toe(s): Secondary | ICD-10-CM

## 2022-11-28 ENCOUNTER — Telehealth: Payer: Self-pay | Admitting: Orthopedic Surgery

## 2022-11-28 NOTE — Telephone Encounter (Signed)
Sent email and phone call to ask her to schedule an AWV.

## 2022-11-29 ENCOUNTER — Ambulatory Visit: Payer: PPO | Admitting: Podiatry

## 2022-12-04 ENCOUNTER — Ambulatory Visit: Payer: PPO | Admitting: Podiatry

## 2022-12-04 ENCOUNTER — Encounter: Payer: Self-pay | Admitting: Podiatry

## 2022-12-04 DIAGNOSIS — M79675 Pain in left toe(s): Secondary | ICD-10-CM | POA: Diagnosis not present

## 2022-12-04 DIAGNOSIS — M79674 Pain in right toe(s): Secondary | ICD-10-CM | POA: Diagnosis not present

## 2022-12-04 DIAGNOSIS — B351 Tinea unguium: Secondary | ICD-10-CM

## 2022-12-04 DIAGNOSIS — M2012 Hallux valgus (acquired), left foot: Secondary | ICD-10-CM

## 2022-12-04 DIAGNOSIS — M2042 Other hammer toe(s) (acquired), left foot: Secondary | ICD-10-CM

## 2022-12-04 NOTE — Progress Notes (Signed)
This patient presents to the office with chief complaint of long thick painful nails.  Patient says the nails are painful walking and wearing shoes.  This patient is unable to self treat.  This patient is unable to trim her nails since she is unable to reach her nails.  She presents to the office for preventative foot care services.  General Appearance  Alert, conversant and in no acute stress.  Vascular  Dorsalis pedis and posterior tibial  pulses are palpable  bilaterally.  Capillary return is within normal limits  bilaterally. Temperature is within normal limits  bilaterally.  Neurologic  Senn-Weinstein monofilament wire test within normal limits  bilaterally. Muscle power within normal limits bilaterally.  Nails Thick disfigured discolored nails with subungual debris  from hallux to fifth toes bilaterally. No evidence of bacterial infection or drainage bilaterally.  Orthopedic  No limitations of motion  feet .  No crepitus or effusions noted. 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  Skin  normotropic skin with no porokeratosis noted bilaterally.  No signs of infections or ulcers noted.   Callus 1/2 B/L. Asymptomatic. Asymptomatic callus left heel.  Onychomycosis  Nails  B/L.  Pain in right toes  Pain in left toes    Debridement of nails both feet with nail nipper  followed by trimming the nails with dremel tool. RTC 3 months.   Helane Gunther DPM

## 2022-12-14 ENCOUNTER — Encounter: Payer: Self-pay | Admitting: Orthopedic Surgery

## 2022-12-14 ENCOUNTER — Ambulatory Visit (INDEPENDENT_AMBULATORY_CARE_PROVIDER_SITE_OTHER): Payer: PPO | Admitting: Orthopedic Surgery

## 2022-12-14 VITALS — BP 120/60 | HR 79 | Temp 97.7°F | Resp 16 | Ht 61.0 in | Wt 122.6 lb

## 2022-12-14 DIAGNOSIS — Z0000 Encounter for general adult medical examination without abnormal findings: Secondary | ICD-10-CM

## 2022-12-14 DIAGNOSIS — Z23 Encounter for immunization: Secondary | ICD-10-CM

## 2022-12-14 NOTE — Patient Instructions (Signed)
  Ms. Terri Mcgee , Thank you for taking time to come for your Medicare Wellness Visit. I appreciate your ongoing commitment to your health goals. Please review the following plan we discussed and let me know if I can assist you in the future.   These are the goals we discussed:  Goals      DIET - INCREASE WATER INTAKE     DIET - REDUCE CALORIE INTAKE        This is a list of the screening recommended for you and due dates:  Health Maintenance  Topic Date Due   Pneumonia Vaccine (2 of 2 - PCV) 06/12/2023*   Medicare Annual Wellness Visit  12/14/2023   Flu Shot  Completed   DEXA scan (bone density measurement)  Completed   Hepatitis C Screening  Completed   Zoster (Shingles) Vaccine  Completed   HPV Vaccine  Aged Out   DTaP/Tdap/Td vaccine  Discontinued   COVID-19 Vaccine  Discontinued  *Topic was postponed. The date shown is not the original due date.   Please schedule mammogram and bone density anytime after 01/06/2023  Please take calcium and vitamin D daily   Flu vaccine given today

## 2022-12-14 NOTE — Progress Notes (Signed)
Subjective:   Terri Mcgee is a 77 y.o. female who presents for Medicare Annual (Subsequent) preventive examination.  Visit Complete: In person  Patient Medicare AWV questionnaire was completed by the patient on 12/14/2022; I have confirmed that all information answered by patient is correct and no changes since this date.  Cardiac Risk Factors include: advanced age (>61men, >46 women);hypertension     Objective:    Today's Vitals   12/14/22 1355  BP: 120/60  Pulse: 79  Resp: 16  Temp: 97.7 F (36.5 C)  SpO2: 97%  Weight: 122 lb 10.1 oz (55.6 kg)  Height: 5\' 1"  (1.549 m)   Body mass index is 23.17 kg/m.     12/14/2022    2:02 PM 09/29/2021    9:23 AM 09/23/2021    1:27 PM 09/22/2021    4:30 PM 05/04/2021    1:16 PM 10/07/2020    9:37 AM 05/18/2020    9:33 AM  Advanced Directives  Does Patient Have a Medical Advance Directive? Yes No No No Yes Yes Yes  Type of Estate agent of [place];Living will;Out of facility DNR (pink MOST or yellow form)    Healthcare Power of [employer] of [place];Living will Healthcare Power of [place];Living will  Does patient want to make changes to medical advance directive? No - Patient declined    No - Patient declined No - Patient declined No - Patient declined  Copy of Healthcare Power of Attorney in Chart? No - copy requested    Yes - validated most recent copy scanned in chart (See row information) Yes - validated most recent copy scanned in chart (See row information) Yes - validated most recent copy scanned in chart (See row information)  Would patient like information on creating a medical advance directive?  No - Patient declined No - Patient declined No - Patient declined       Current Medications (verified) Outpatient Encounter Medications as of 12/14/2022  Medication Sig   acetaminophen (TYLENOL) 500 MG tablet Take 500-1,000 mg by mouth daily as needed for mild pain. 1-2 tablets   aspirin 81 MG  chewable tablet Chew 81 mg by mouth daily.   Calcium Carbonate-Vit D-Min (CALCIUM 1200 PO) Take 1 tablet by mouth daily.   gabapentin (NEURONTIN) 100 MG capsule Take 1 capsule (100 mg total) by mouth at bedtime. Needs an appointment before anymore future refills.   levothyroxine (SYNTHROID) 88 MCG tablet Take 1 tablet (88 mcg total) by mouth daily.   lisinopril-hydrochlorothiazide (ZESTORETIC) 10-12.5 MG tablet Take 1 tablet by mouth once daily   Multiple Vitamins-Minerals (MULTIVITAMIN GUMMIES ADULT PO) Take 1 capsule by mouth daily in the afternoon.   pantoprazole (PROTONIX) 40 MG tablet Take 1 tablet (40 mg total) by mouth daily.   simvastatin (ZOCOR) 20 MG tablet TAKE 1 TABLET BY MOUTH ONCE DAILY AT 6 PM   Turmeric 500 MG CAPS Take 500 mg by mouth daily.   [DISCONTINUED] ondansetron (ZOFRAN) 4 MG tablet Take 1 tablet (4 mg total) by mouth every 8 (eight) hours as needed for nausea.   [DISCONTINUED] PFIZER COVID-19 VAC BIVALENT injection    [DISCONTINUED] potassium chloride (KLOR-CON M) 10 MEQ tablet Take 1 tablet (10 mEq total) by mouth daily as needed.   No facility-administered encounter medications on file as of 12/14/2022.    Allergies (verified) Penicillins, Terbinafine and related, Tetanus toxoid, and Tetanus toxoid adsorbed   History: Past Medical History:  Diagnosis Date   High cholesterol  History of bone density study    History of mammogram 2021   Hypertension    Hypothyroidism    left Toe pain 12/11/2020   Per patient   Past Surgical History:  Procedure Laterality Date   OTHER SURGICAL HISTORY     Cataracts Surgery by Dr.Hecker   XI ROBOTIC ASSISTED HIATAL HERNIA REPAIR N/A 09/23/2021   Procedure: XI ROBOTIC ASSISTED HIATAL HERNIA REPAIR TOUPET FUNDOPLICATION, MESH REINFORCEMENT;  Surgeon: Karie Soda, MD;  Location: [facility];  Service: General;  Laterality: N/A;   Family History  Problem Relation Age of Onset   Cancer Mother    Congestive Heart Failure Father     Social History   Socioeconomic History   Marital status: Married    Spouse name: Not on file   Number of children: Not on file   Years of education: Not on file   Highest education level: Not on file  Occupational History   Not on file  Tobacco Use   Smoking status: Never   Smokeless tobacco: Never  Vaping Use   Vaping status: Never Used  Substance and Sexual Activity   Alcohol use: Never   Drug use: Never   Sexual activity: Not Currently  Other Topics Concern   Not on file  Social History Narrative   Tobacco use, amount per day now: I have never smoked.   Past tobacco use, amount per day:   How many years did you use tobacco:   Alcohol use (drinks per week): I do not drink.   Diet:   Do you drink/eat things with caffeine: Yes.   Marital status: Widow                                 What year were you married? 1980   Do you live in a house, apartment, assisted living, condo, trailer, etc.? House.   Is it one or more stories? Yes but Im moving to a 1 story.   How many persons live in your home? 1   Do you have pets in your home?( please list) Dog   Highest Level of education completed? Graduate School.   Current or past profession: Environmental health practitioner.   Do you exercise? Yes.                                 Type and how often? Zumba 2 times a week/ Silver sneakers 2 times a week.    Do you have a living will? Yes   Do you have a DNR form? Yes                                  If not, do you want to discuss one?   Do you have signed POA/HPOA forms? Yes                        If so, please bring to you appointment      Do you have any difficulty bathing or dressing yourself? No.   Do you have any difficulty preparing food or eating? No.   Do you have any difficulty managing your medications? No.   Do you have any difficulty managing your finances? No.   Do you have any difficulty affording your medications? No.  Social Determinants of Health   Financial Resource  Strain: Low Risk  (12/14/2022)   Overall Financial Resource Strain (CARDIA)    Difficulty of Paying Living Expenses: Not hard at all  Food Insecurity: No Food Insecurity (12/14/2022)   Hunger Vital Sign    Worried About Running Out of Food in the Last Year: Never true    Ran Out of Food in the Last Year: Never true  Transportation Needs: No Transportation Needs (12/14/2022)   PRAPARE - Administrator, Civil Service (Medical): No    Lack of Transportation (Non-Medical): No  Physical Activity: Sufficiently Active (12/14/2022)   Exercise Vital Sign    Days of Exercise per Week: 3 days    Minutes of Exercise per Session: 60 min  Stress: Stress Concern Present (12/14/2022)   [employer] of Occupational Health - Occupational Stress Questionnaire    Feeling of Stress : Rather much  Social Connections: Moderately Isolated (12/14/2022)   Social Connection and Isolation Panel [NHANES]    Frequency of Communication with Friends and Family: More than three times a week    Frequency of Social Gatherings with Friends and Family: More than three times a week    Attends Religious Services: Never    Database administrator or Organizations: Yes    Attends Engineer, structural: More than 4 times per year    Marital Status: Widowed    Tobacco Counseling Counseling given: Not Answered   Clinical Intake:  Pre-visit preparation completed: Yes  Pain : No/denies pain     BMI - recorded: 23.17 Nutritional Status: BMI of 19-24  Normal Nutritional Risks: None Diabetes: No  How often do you need to have someone help you when you read instructions, pamphlets, or other written materials from your doctor or pharmacy?: 1 - Never What is the last grade level you completed in school?: Masters degree  Interpreter Needed?: No      Activities of Daily Living    12/14/2022    2:30 PM  In your present state of health, do you have any difficulty performing the following activities:   Hearing? 0  Vision? 0  Difficulty concentrating or making decisions? 0  Walking or climbing stairs? 0  Dressing or bathing? 0  Doing errands, shopping? 0  Preparing Food and eating ? N  Using the Toilet? N  In the past six months, have you accidently leaked urine? N  Do you have problems with loss of bowel control? N  Managing your Medications? N  Managing your Finances? N  Housekeeping or managing your Housekeeping? N    Patient Care Team: Octavia Heir, NP as PCP - General (Adult Health Nurse Practitioner) Mateo Flow, MD as Consulting Physician (Ophthalmology) Karie Soda, MD as Consulting Physician (General Surgery)  Indicate any recent Medical Services you may have received from other than [facility] providers in the past year (date may be approximate).     Assessment:   This is a routine wellness examination for Patty.  Hearing/Vision screen Hearing Screening - Comments:: No hearing concerns.  Vision Screening - Comments:: Some vision concerns. Patient last eye exam July 2023. Patient doesn't wear prescription glasses. Patient wears reading glasses.    Goals Addressed             This Visit's Progress    DIET - INCREASE WATER INTAKE   Not on track      Depression Screen    12/14/2022    1:58 PM 04/13/2022  2:07 PM 05/05/2021   10:41 AM 10/07/2020    9:49 AM 10/07/2020    9:36 AM 03/16/2020    1:28 PM  PHQ 2/9 Scores  PHQ - 2 Score 6 0 0 3 0 3  PHQ- 9 Score 15   6  11     Fall Risk    12/14/2022    2:34 PM 12/14/2022    1:58 PM 04/13/2022    2:06 PM 05/05/2021   10:41 AM 10/28/2020   10:13 AM  Fall Risk   Falls in the past year? 1 1 1 1  0  Number falls in past yr: 0 0 1 0 0  Injury with Fall? 1 1 1  0 0  Risk for fall due to : History of fall(s);Impaired balance/gait Impaired balance/gait;Impaired mobility History of fall(s) No Fall Risks No Fall Risks  Follow up Falls evaluation completed;Education provided;Falls prevention discussed Falls evaluation  completed;Education provided;Falls prevention discussed Falls evaluation completed Falls evaluation completed Falls evaluation completed;Education provided;Falls prevention discussed    MEDICARE RISK AT HOME: Medicare Risk at Home Any stairs in or around the home?: No If so, are there any without handrails?: No Home free of loose throw rugs in walkways, pet beds, electrical cords, etc?: Yes Adequate lighting in your home to reduce risk of falls?: Yes Life alert?: No Use of a cane, walker or w/c?: No Grab bars in the bathroom?: No Shower chair or bench in shower?: No Elevated toilet seat or a handicapped toilet?: Yes  TIMED UP AND GO:  Was the test performed?  No    Cognitive Function:    12/14/2022    2:02 PM 04/27/2020   10:12 AM  MMSE - Mini Mental State Exam  Orientation to time 5 5  Orientation to Place 5 5  Registration 3 3  Attention/ Calculation 5 5  Recall 3 3  Language- name 2 objects 2 2  Language- repeat 1 1  Language- follow 3 step command 3 3  Language- read & follow direction 1 1  Write a sentence 1 1  Copy design 1 1  Total score 30 30        10/07/2020   10:01 AM 10/07/2020    9:39 AM  6CIT Screen  What Year? 0 points 0 points  What month? 0 points 0 points  What time? 0 points 0 points  Count back from 20 0 points 0 points  Months in reverse 0 points 0 points  Repeat phrase 0 points 0 points  Total Score 0 points 0 points    Immunizations Immunization History  Administered Date(s) Administered   Fluad Quad(high Dose 65+) 01/08/2020   Fluad Trivalent(High Dose 65+) 12/14/2022   Fluzone Influenza virus vaccine,trivalent (IIV3), split virus 11/20/2011, 11/28/2012   Influenza, High Dose Seasonal PF 11/11/2020   Influenza,inj,quad, With Preservative 11/28/2012, 01/03/2017   Influenza-Unspecified 11/20/2011, 12/06/2018   PFIZER Comirnaty(Gray Top)Covid-19 Tri-Sucrose Vaccine 07/27/2020   PFIZER(Purple Top)SARS-COV-2 Vaccination 05/04/2019,  05/28/2019, 11/28/2019   Pneumococcal Polysaccharide-23 11/20/2011   Pneumococcal-Unspecified 11/20/2011   Zoster Recombinant(Shingrix) 11/02/2017, 01/11/2018   Zoster, Live 11/12/2009    TDAP status: Due, Education has been provided regarding the importance of this vaccine. Advised may receive this vaccine at local pharmacy or Health Dept. Aware to provide a copy of the vaccination record if obtained from local pharmacy or Health Dept. Verbalized acceptance and understanding.  Flu Vaccine status: Up to date  Pneumococcal vaccine status: Due, Education has been provided regarding the importance of this vaccine. Advised may  receive this vaccine at local pharmacy or Health Dept. Aware to provide a copy of the vaccination record if obtained from local pharmacy or Health Dept. Verbalized acceptance and understanding.  Covid-19 vaccine status: Completed vaccines  Qualifies for Shingles Vaccine? Yes   Zostavax completed Yes   Shingrix Completed?: Yes  Screening Tests Health Maintenance  Topic Date Due   DTaP/Tdap/Td (1 - Tdap) Never done   Pneumonia Vaccine 44+ Years old (2 of 2 - PCV) 06/12/2023 (Originally 11/19/2012)   Medicare Annual Wellness (AWV)  12/14/2023   INFLUENZA VACCINE  Completed   DEXA SCAN  Completed   Hepatitis C Screening  Completed   Zoster Vaccines- Shingrix  Completed   HPV VACCINES  Aged Out   COVID-19 Vaccine  Discontinued    Health Maintenance  Health Maintenance Due  Topic Date Due   DTaP/Tdap/Td (1 - Tdap) Never done    Colorectal cancer screening: No longer required.   Mammogram status: Completed 01/05/2022. Repeat every year  Bone Density status: Completed 2022. Results reflect: Bone density results: OSTEOPENIA. Repeat every 2 years.  Lung Cancer Screening: (Low Dose CT Chest recommended if Age 71-80 years, 20 pack-year currently smoking OR have quit w/in 15years.) does not qualify.   Lung Cancer Screening Referral: No  Additional  Screening:  Hepatitis C Screening: does not qualify; Completed   Vision Screening: Recommended annual ophthalmology exams for early detection of glaucoma and other disorders of the eye. Is the patient up to date with their annual eye exam?  Yes  Who is the provider or what is the name of the office in which the patient attends annual eye exams? Dr. Elmer Picker retired If pt is not established with a provider, would they like to be referred to a provider to establish care? No .   Dental Screening: Recommended annual dental exams for proper oral hygiene  Diabetic Foot Exam: Diabetic Foot Exam: Completed 12/14/2022  Community Resource Referral / Chronic Care Management: CRR required this visit?  No   CCM required this visit?  No     Plan:     I have personally reviewed and noted the following in the patient's chart:   Medical and social history Use of alcohol, tobacco or illicit drugs  Current medications and supplements including opioid prescriptions. Patient is not currently taking opioid prescriptions. Functional ability and status Nutritional status Physical activity Advanced directives List of other physicians Hospitalizations, surgeries, and ER visits in previous 12 months Vitals Screenings to include cognitive, depression, and falls Referrals and appointments  In addition, I have reviewed and discussed with patient certain preventive protocols, quality metrics, and best practice recommendations. A written personalized care plan for preventive services as well as general preventive health recommendations were provided to patient.     Octavia Heir, NP   12/14/2022   After Visit Summary: (MyChart) Due to this being a telephonic visit, the after visit summary with patients personalized plan was offered to patient via MyChart   Nurse Notes: MMSE 30/30, reports having Prevnar 20 at [facility] last year, flu vaccine given today.

## 2022-12-21 ENCOUNTER — Other Ambulatory Visit: Payer: Self-pay | Admitting: Orthopedic Surgery

## 2022-12-21 DIAGNOSIS — M79675 Pain in left toe(s): Secondary | ICD-10-CM

## 2023-01-04 ENCOUNTER — Encounter: Payer: Self-pay | Admitting: Orthopedic Surgery

## 2023-01-04 ENCOUNTER — Ambulatory Visit (INDEPENDENT_AMBULATORY_CARE_PROVIDER_SITE_OTHER): Payer: PPO | Admitting: Orthopedic Surgery

## 2023-01-04 VITALS — BP 118/72 | HR 61 | Temp 98.1°F | Ht 61.0 in | Wt 123.4 lb

## 2023-01-04 DIAGNOSIS — M545 Low back pain, unspecified: Secondary | ICD-10-CM

## 2023-01-04 DIAGNOSIS — F339 Major depressive disorder, recurrent, unspecified: Secondary | ICD-10-CM

## 2023-01-04 DIAGNOSIS — E7800 Pure hypercholesterolemia, unspecified: Secondary | ICD-10-CM | POA: Diagnosis not present

## 2023-01-04 DIAGNOSIS — Z8719 Personal history of other diseases of the digestive system: Secondary | ICD-10-CM

## 2023-01-04 DIAGNOSIS — E039 Hypothyroidism, unspecified: Secondary | ICD-10-CM | POA: Diagnosis not present

## 2023-01-04 DIAGNOSIS — Z9889 Other specified postprocedural states: Secondary | ICD-10-CM

## 2023-01-04 DIAGNOSIS — G8929 Other chronic pain: Secondary | ICD-10-CM

## 2023-01-04 DIAGNOSIS — I10 Essential (primary) hypertension: Secondary | ICD-10-CM

## 2023-01-04 MED ORDER — GABAPENTIN 100 MG PO CAPS: 200.0000 mg | ORAL_CAPSULE | Freq: Every day | ORAL | Status: DC

## 2023-01-04 NOTE — Progress Notes (Signed)
Careteam: Patient Care Team: Octavia Heir, NP as PCP - General (Adult Health Nurse Practitioner) Mateo Flow, MD as Consulting Physician (Ophthalmology) Karie Soda, MD as Consulting Physician (General Surgery)  Seen by: Hazle Nordmann, AGNP-C  PLACE OF SERVICE:  [facility]  Advanced Directive information Does Patient Have a Medical Advance Directive?: Yes, Type of Advance Directive: Healthcare Power of [place];Out of facility DNR (pink MOST or yellow form);Living will, Does patient want to make changes to medical advance directive?: No - Patient declined  Allergies  Allergen Reactions   Penicillins Shortness Of Breath and Rash    As a child   Terbinafine And Related     Headache, blurred vision, dizziness   Tetanus Toxoid Rash   Tetanus Toxoid Adsorbed Rash    Chief Complaint  Patient presents with   Medical Management of Chronic Issues    Medical Management of Chronic Issues. 6 Month follow up     HPI: Patient is a 77 y.o. female seen today for medical management of chronic conditions.   Lost 10 lbs since 04/2022. She is very active and exercises a few times daily.   S/p hernia repair 09/2021> now having intermittent diarrhea and mild abdominal pain. She is watching food triggers to avoid symptoms.   Chronic lower back pain> she reports relief with tylenol 1000 mg every morning and gabapentin 200 mg every evening.   Depression ongoing. Unsuccessful trial SSRI. Exercise helps. Recently adopted 2 kittens.   Review of Systems:  Review of Systems  Constitutional: Negative.   HENT: Negative.    Eyes: Negative.   Respiratory: Negative.    Cardiovascular: Negative.   Gastrointestinal:  Positive for abdominal pain and diarrhea.  Genitourinary: Negative.   Musculoskeletal:  Positive for back pain.  Skin: Negative.   Neurological: Negative.   Psychiatric/Behavioral:  Positive for depression. Negative for suicidal ideas. The patient is not nervous/anxious.      Past Medical History:  Diagnosis Date   High cholesterol    History of bone density study    History of mammogram 2021   Hypertension    Hypothyroidism    left Toe pain 12/11/2020   Per patient   Past Surgical History:  Procedure Laterality Date   OTHER SURGICAL HISTORY     Cataracts Surgery by Dr.Hecker   XI ROBOTIC ASSISTED HIATAL HERNIA REPAIR N/A 09/23/2021   Procedure: XI ROBOTIC ASSISTED HIATAL HERNIA REPAIR TOUPET FUNDOPLICATION, MESH REINFORCEMENT;  Surgeon: Karie Soda, MD;  Location: [facility];  Service: General;  Laterality: N/A;   Social History:   reports that she has never smoked. She has never used smokeless tobacco. She reports that she does not drink alcohol and does not use drugs.  Family History  Problem Relation Age of Onset   Cancer Mother    Congestive Heart Failure Father     Medications: Patient's Medications  New Prescriptions   No medications on file  Previous Medications   ACETAMINOPHEN (TYLENOL) 500 MG TABLET    Take 500-1,000 mg by mouth daily as needed for mild pain. 1-2 tablets   ASPIRIN 81 MG CHEWABLE TABLET    Chew 81 mg by mouth daily.   CALCIUM CARBONATE-VIT D-MIN (CALCIUM 1200 PO)    Take 1 tablet by mouth daily.   GABAPENTIN (NEURONTIN) 100 MG CAPSULE    Take 1 capsule (100 mg total) by mouth at bedtime.   LEVOTHYROXINE (SYNTHROID) 88 MCG TABLET    Take 1 tablet (88 mcg total) by mouth daily.  LISINOPRIL-HYDROCHLOROTHIAZIDE (ZESTORETIC) 10-12.5 MG TABLET    Take 1 tablet by mouth once daily   MULTIPLE VITAMINS-MINERALS (MULTIVITAMIN GUMMIES ADULT PO)    Take 1 capsule by mouth daily in the afternoon.   PANTOPRAZOLE (PROTONIX) 40 MG TABLET    Take 1 tablet (40 mg total) by mouth daily.   SIMVASTATIN (ZOCOR) 20 MG TABLET    TAKE 1 TABLET BY MOUTH ONCE DAILY AT 6 PM   TURMERIC 500 MG CAPS    Take 500 mg by mouth daily.  Modified Medications   No medications on file  Discontinued Medications   No medications on file    Physical  Exam:  Vitals:   01/04/23 0857  BP: 118/72  Pulse: 61  Temp: 98.1 F (36.7 C)  SpO2: 99%  Weight: 123 lb 6.4 oz (56 kg)  Height: 5\' 1"  (1.549 m)   Body mass index is 23.32 kg/m. Wt Readings from Last 3 Encounters:  01/04/23 123 lb 6.4 oz (56 kg)  12/14/22 122 lb 10.1 oz (55.6 kg)  04/13/22 131 lb 6 oz (59.6 kg)    Physical Exam Vitals reviewed.  Constitutional:      General: She is not in acute distress. HENT:     Head: Normocephalic.  Eyes:     General:        Right eye: No discharge.        Left eye: No discharge.  Cardiovascular:     Rate and Rhythm: Normal rate and regular rhythm.     Pulses: Normal pulses.     Heart sounds: Normal heart sounds.  Pulmonary:     Effort: Pulmonary effort is normal. No respiratory distress.     Breath sounds: Normal breath sounds. No wheezing.  Abdominal:     General: Bowel sounds are normal. There is no distension.     Palpations: Abdomen is soft.     Tenderness: There is no abdominal tenderness.  Musculoskeletal:     Cervical back: Neck supple.     Right lower leg: No edema.     Left lower leg: No edema.  Skin:    General: Skin is warm.     Capillary Refill: Capillary refill takes less than 2 seconds.  Neurological:     General: No focal deficit present.     Mental Status: She is alert and oriented to person, place, and time.  Psychiatric:        Mood and Affect: Mood normal.        Behavior: Behavior normal.     Labs reviewed: Basic Metabolic Panel: Recent Labs    04/14/22 0833 06/08/22 0902  NA 138  --   K 4.5  --   CL 100  --   CO2 30  --   GLUCOSE 88  --   BUN 27*  --   CREATININE 0.82  --   CALCIUM 9.7  --   TSH 5.43* 1.66   Liver Function Tests: Recent Labs    04/14/22 0833  AST 19  ALT 15  BILITOT 0.5  PROT 6.9   No results for input(s): "LIPASE", "AMYLASE" in the last 8760 hours. No results for input(s): "AMMONIA" in the last 8760 hours. CBC: Recent Labs    04/14/22 0833  WBC 5.1   NEUTROABS 2,708  HGB 14.6  HCT 42.7  MCV 93.8  PLT 204   Lipid Panel: Recent Labs    04/14/22 0833  CHOL 154  HDL 68  LDLCALC 64  TRIG 141  CHOLHDL 2.3   TSH: Recent Labs    04/14/22 0833 06/08/22 0902  TSH 5.43* 1.66   A1C: No results found for: "HGBA1C"   Assessment/Plan 1. Essential hypertension - controlled - cont lisinopril-HCTZ - CBC with Differential/Platelet - Complete Metabolic Panel with eGFR  2. Chronic midline low back pain without sciatica - ongoing - improved with weight loss and scheduled tylenol/gabapentin - gabapentin (NEURONTIN) 100 MG capsule; Take 2 capsules (200 mg total) by mouth at bedtime.  3. Acquired hypothyroidism - cont levothyroxine - TSH  4. Pure hypercholesterolemia - total 154, LDL 64 04/14/2022 - Lipid Panel  5. S/P hernia repair - reports mild abdominal pain and diarrhea - exam unremarkable - recommend trying fiber gummies when diarrhea occurs  6. Recurrent depression (HCC) - began after husband passed - dog recently passed  - unsuccessful trial SSRI - not interested in antidepressant at this time - PHQ2 score 0 today - recently adopted kittens  Total time: 32 minutes. Greater than 50% of total time spent doing patient education regarding health maintenance, HTN, HLD, hypothyroidism, chronic back pain depression and diarrhea including symptom/medication management.     Next appt: Visit date not found  Jada Kuhnert Scherry Ran  [facility] [phone]

## 2023-01-04 NOTE — Patient Instructions (Addendum)
Consider fiber gummies to help with diarrhea  Please schedule mammogram and bone density within next year

## 2023-01-05 LAB — COMPLETE METABOLIC PANEL WITH GFR
AG Ratio: 1.4 (calc) (ref 1.0–2.5)
ALT: 15 U/L (ref 6–29)
AST: 17 U/L (ref 10–35)
Albumin: 4.2 g/dL (ref 3.6–5.1)
Alkaline phosphatase (APISO): 89 U/L (ref 37–153)
BUN: 25 mg/dL (ref 7–25)
CO2: 28 mmol/L (ref 20–32)
Calcium: 10 mg/dL (ref 8.6–10.4)
Chloride: 100 mmol/L (ref 98–110)
Creat: 0.81 mg/dL (ref 0.60–1.00)
Globulin: 3.1 g/dL (ref 1.9–3.7)
Glucose, Bld: 95 mg/dL (ref 65–99)
Potassium: 5.2 mmol/L (ref 3.5–5.3)
Sodium: 138 mmol/L (ref 135–146)
Total Bilirubin: 0.4 mg/dL (ref 0.2–1.2)
Total Protein: 7.3 g/dL (ref 6.1–8.1)
eGFR: 75 mL/min/{1.73_m2} (ref >=60)

## 2023-01-05 LAB — LIPID PANEL
Cholesterol: 134 mg/dL (ref <200)
HDL: 59 mg/dL (ref >=50)
LDL Cholesterol (Calc): 51 mg/dL
Non-HDL Cholesterol (Calc): 75 mg/dL (ref <130)
Total CHOL/HDL Ratio: 2.3 (calc) (ref <5.0)
Triglycerides: 165 mg/dL — ABNORMAL HIGH (ref <150)

## 2023-01-05 LAB — CBC WITH DIFFERENTIAL/PLATELET
Absolute Lymphocytes: 1816 {cells}/uL (ref 850–3900)
Absolute Monocytes: 755 {cells}/uL (ref 200–950)
Basophils Absolute: 48 {cells}/uL (ref 0–200)
Basophils Relative: 0.7 %
Eosinophils Absolute: 88 {cells}/uL (ref 15–500)
Eosinophils Relative: 1.3 %
HCT: 42.5 % (ref 35.0–45.0)
Hemoglobin: 13.6 g/dL (ref 11.7–15.5)
MCH: 30.7 pg (ref 27.0–33.0)
MCHC: 32 g/dL (ref 32.0–36.0)
MCV: 95.9 fL (ref 80.0–100.0)
MPV: 10.2 fL (ref 7.5–12.5)
Monocytes Relative: 11.1 %
Neutro Abs: 4094 {cells}/uL (ref 1500–7800)
Neutrophils Relative %: 60.2 %
Platelets: 270 10*3/uL (ref 140–400)
RBC: 4.43 10*6/uL (ref 3.80–5.10)
RDW: 12.5 % (ref 11.0–15.0)
Total Lymphocyte: 26.7 %
WBC: 6.8 10*3/uL (ref 3.8–10.8)

## 2023-01-05 LAB — TSH: TSH: 2.42 m[IU]/L (ref 0.40–4.50)

## 2023-02-06 ENCOUNTER — Telehealth: Payer: Self-pay

## 2023-02-06 DIAGNOSIS — G8929 Other chronic pain: Secondary | ICD-10-CM

## 2023-02-06 DIAGNOSIS — Z1231 Encounter for screening mammogram for malignant neoplasm of breast: Secondary | ICD-10-CM | POA: Diagnosis not present

## 2023-02-06 DIAGNOSIS — M8588 Other specified disorders of bone density and structure, other site: Secondary | ICD-10-CM | POA: Diagnosis not present

## 2023-02-06 DIAGNOSIS — Z8262 Family history of osteoporosis: Secondary | ICD-10-CM | POA: Diagnosis not present

## 2023-02-06 LAB — HM MAMMOGRAPHY

## 2023-02-06 MED ORDER — GABAPENTIN 100 MG PO CAPS
200.0000 mg | ORAL_CAPSULE | Freq: Every day | ORAL | Status: DC
Start: 2023-02-06 — End: 2023-02-12

## 2023-02-06 NOTE — Telephone Encounter (Signed)
Patient is calling for a medication refill on Gabapentin 100 mg. Medication has been sent to the pharmacy

## 2023-02-12 ENCOUNTER — Telehealth: Payer: Self-pay | Admitting: Orthopedic Surgery

## 2023-02-12 ENCOUNTER — Other Ambulatory Visit: Payer: Self-pay | Admitting: *Deleted

## 2023-02-12 ENCOUNTER — Other Ambulatory Visit: Payer: Self-pay | Admitting: Orthopedic Surgery

## 2023-02-12 DIAGNOSIS — M545 Low back pain, unspecified: Secondary | ICD-10-CM

## 2023-02-12 MED ORDER — GABAPENTIN 100 MG PO CAPS: 200.0000 mg | ORAL_CAPSULE | Freq: Every day | ORAL | 5 refills | Status: DC

## 2023-02-12 MED ORDER — GABAPENTIN 100 MG PO CAPS
200.0000 mg | ORAL_CAPSULE | Freq: Every day | ORAL | 5 refills | Status: DC
Start: 2023-02-12 — End: 2023-02-13

## 2023-02-12 NOTE — Telephone Encounter (Signed)
I have sent a new prescription to [facility] on [address]. If you continue to have problems, recommend we send to another pharmacy.

## 2023-02-12 NOTE — Telephone Encounter (Signed)
Patient called and said her prescription for gabapentin 200 still was not at [facility] pharmacy. She called last Tuesday, 02/06/23 and spoke to [place] who put the order in then. However, patient called [facility] again today and they still have not received the new prescription order. Patient is now out of her gabapentin and is having headaches and trouble sleeping.

## 2023-02-12 NOTE — Telephone Encounter (Signed)
Patient called and left message on Clinical intake stating that she spoke with someone last Tuesday and a Rx for Gabapentin was suppose to be sent to her pharmacy.   Rx was dated that it was sent on 02/06/2023 but the status of the Rx was a "no print"  Resent Rx to pharmacy as requested. Tried calling patient, LMOM that Rx was sent to pharmacy.

## 2023-02-12 NOTE — Telephone Encounter (Signed)
Patient notified

## 2023-02-13 ENCOUNTER — Other Ambulatory Visit: Payer: Self-pay

## 2023-02-13 DIAGNOSIS — G8929 Other chronic pain: Secondary | ICD-10-CM

## 2023-02-13 MED ORDER — GABAPENTIN 100 MG PO CAPS: 200.0000 mg | ORAL_CAPSULE | Freq: Every day | ORAL | 0 refills | Status: DC

## 2023-02-23 ENCOUNTER — Other Ambulatory Visit: Payer: Self-pay | Admitting: Orthopedic Surgery

## 2023-02-23 DIAGNOSIS — I10 Essential (primary) hypertension: Secondary | ICD-10-CM

## 2023-02-27 ENCOUNTER — Telehealth: Payer: Self-pay

## 2023-02-27 NOTE — Telephone Encounter (Signed)
Recent bone density reveals osteopenia, precursor to osteoporosis. Continue weight bearing exercise. Start Caltrate supplement for bone health. You can purchase this over the counter at pharmacy or grocery store.

## 2023-02-27 NOTE — Telephone Encounter (Signed)
Patient was advised and verbalized understanding. 

## 2023-02-27 NOTE — Telephone Encounter (Signed)
Patient called to get her bone density results. She reports that [facility] advised her of the mammogram results but stated she had to reach out to her PCP for dexa scan results.

## 2023-03-01 ENCOUNTER — Other Ambulatory Visit: Payer: Self-pay | Admitting: Orthopedic Surgery

## 2023-03-01 DIAGNOSIS — E039 Hypothyroidism, unspecified: Secondary | ICD-10-CM

## 2023-03-05 ENCOUNTER — Ambulatory Visit: Payer: PPO | Admitting: Podiatry

## 2023-03-08 ENCOUNTER — Encounter: Payer: Self-pay | Admitting: Podiatry

## 2023-03-08 ENCOUNTER — Ambulatory Visit: Payer: PPO | Admitting: Podiatry

## 2023-03-08 DIAGNOSIS — B351 Tinea unguium: Secondary | ICD-10-CM

## 2023-03-08 DIAGNOSIS — M2012 Hallux valgus (acquired), left foot: Secondary | ICD-10-CM

## 2023-03-08 DIAGNOSIS — M2042 Other hammer toe(s) (acquired), left foot: Secondary | ICD-10-CM

## 2023-03-08 DIAGNOSIS — M79674 Pain in right toe(s): Secondary | ICD-10-CM

## 2023-03-08 DIAGNOSIS — M79675 Pain in left toe(s): Secondary | ICD-10-CM

## 2023-03-08 NOTE — Progress Notes (Signed)
This patient presents to the office with chief complaint of long thick painful nails.  Patient says the nails are painful walking and wearing shoes.  This patient is unable to self treat.  This patient is unable to trim her nails since she is unable to reach her nails.  She presents to the office for preventative foot care services.  General Appearance  Alert, conversant and in no acute stress.  Vascular  Dorsalis pedis and posterior tibial  pulses are palpable  bilaterally.  Capillary return is within normal limits  bilaterally. Temperature is within normal limits  bilaterally.  Neurologic  Senn-Weinstein monofilament wire test within normal limits  bilaterally. Muscle power within normal limits bilaterally.  Nails Thick disfigured discolored nails with subungual debris  from hallux to fifth toes bilaterally. No evidence of bacterial infection or drainage bilaterally.  Orthopedic  No limitations of motion  feet .  No crepitus or effusions noted. 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  Skin  normotropic skin with no porokeratosis noted bilaterally.  No signs of infections or ulcers noted.   Callus 1/2 B/L. Asymptomatic.   Onychomycosis  Nails  B/L.  Pain in right toes  Pain in left toes    Debridement of nails both feet with nail nipper  followed by trimming the nails with dremel tool. RTC 3 months.   Helane Gunther DPM

## 2023-03-27 ENCOUNTER — Other Ambulatory Visit: Payer: Self-pay | Admitting: Orthopedic Surgery

## 2023-03-27 DIAGNOSIS — E7800 Pure hypercholesterolemia, unspecified: Secondary | ICD-10-CM

## 2023-05-23 ENCOUNTER — Other Ambulatory Visit: Payer: Self-pay | Admitting: Orthopedic Surgery

## 2023-05-23 DIAGNOSIS — I10 Essential (primary) hypertension: Secondary | ICD-10-CM

## 2023-05-30 ENCOUNTER — Other Ambulatory Visit: Payer: Self-pay | Admitting: Orthopedic Surgery

## 2023-05-30 DIAGNOSIS — E039 Hypothyroidism, unspecified: Secondary | ICD-10-CM

## 2023-05-31 ENCOUNTER — Encounter: Admitting: Family

## 2023-06-10 NOTE — Progress Notes (Signed)
   This encounter was created in error - please disregard. No show

## 2023-06-11 ENCOUNTER — Ambulatory Visit: Payer: PPO | Admitting: Podiatry

## 2023-06-12 ENCOUNTER — Ambulatory Visit: Payer: PPO | Admitting: Podiatry

## 2023-06-12 ENCOUNTER — Encounter: Payer: Self-pay | Admitting: Podiatry

## 2023-06-12 DIAGNOSIS — B351 Tinea unguium: Secondary | ICD-10-CM

## 2023-06-12 DIAGNOSIS — M79675 Pain in left toe(s): Secondary | ICD-10-CM | POA: Diagnosis not present

## 2023-06-12 DIAGNOSIS — L84 Corns and callosities: Secondary | ICD-10-CM

## 2023-06-12 DIAGNOSIS — M79674 Pain in right toe(s): Secondary | ICD-10-CM | POA: Diagnosis not present

## 2023-06-12 NOTE — Progress Notes (Signed)
 This patient presents to the office with chief complaint of long thick painful nails.  Patient says the nails are painful walking and wearing shoes.  This patient is unable to self treat.  This patient is unable to trim her nails since she is unable to reach her nails.  She presents to the office for preventative foot care services.  General Appearance  Alert, conversant and in no acute stress.  Vascular  Dorsalis pedis and posterior tibial  pulses are palpable  bilaterally.  Capillary return is within normal limits  bilaterally. Temperature is within normal limits  bilaterally.  Neurologic  Senn-Weinstein monofilament wire test within normal limits  bilaterally. Muscle power within normal limits bilaterally.  Nails Thick disfigured discolored nails with subungual debris  from hallux to fifth toes bilaterally. No evidence of bacterial infection or drainage bilaterally.  Orthopedic  No limitations of motion  feet .  No crepitus or effusions noted. 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  Skin  normotropic skin with no porokeratosis noted bilaterally.  No signs of infections or ulcers noted.   Callus 1/2 B/L. Asymptomatic. Callus left heel.  Onychomycosis  Nails  B/L.  Pain in right toes  Pain in left toes  Callus left foot.  Debridement of nails both feet with nail nipper  followed by trimming the nails with dremel tool.  Debride callus with # 15 blade and dremel tool.  RTC 3 months.   Helane Gunther DPM

## 2023-06-25 ENCOUNTER — Ambulatory Visit: Payer: Self-pay

## 2023-06-25 NOTE — Telephone Encounter (Signed)
 Patient scheduled for 06/28/2023. Message routed as FYI.

## 2023-06-25 NOTE — Telephone Encounter (Signed)
  Chief Complaint: nausea Symptoms: nausea, headache, ringing in ears,  Frequency: over 1 month Pertinent Negatives: Patient denies symptoms at present Disposition: [] ED /[] Urgent Care (no appt availability in office) / [x] Appointment(In office/virtual)/ []  [facility]/ [] Home Care/ [] Refused Recommended Disposition /[] [facility]/ []  Follow-up with PCP Additional Notes: pt states that she has been dealing with nausea, headache, and ringing in her ears for over a month.  states it comes and goes and doesn't last long. States that  she is not currently experiencing these symptoms but did have an episode earlier today.   Copied from CRM [id number]. Topic: Clinical - Red Word Triage >> Jun 25, 2023  2:40 PM Retta Caster wrote: Red Word that prompted transfer to Nurse Triage: Headaches/Dizzyness/Nausested 1-2 weeks Reason for Disposition  Nausea is a chronic symptom (recurrent or ongoing AND present > 4 weeks)  Answer Assessment - Initial Assessment Questions 1. NAUSEA SEVERITY: "How bad is the nausea?" (e.g., mild, moderate, severe; dehydration, weight loss)   - MILD: loss of appetite without change in eating habits   - MODERATE: decreased oral intake without significant weight loss, dehydration, or malnutrition   - SEVERE: inadequate caloric or fluid intake, significant weight loss, symptoms of dehydration     mild 2. ONSET: "When did the nausea begin?"      Over a month ago 3. VOMITING: "Any vomiting?" If Yes, ask: "How many times today?"     no 4. RECURRENT SYMPTOM: "Have you had nausea before?" If Yes, ask: "When was the last time?" "What happened that time?"     Earlier today 5. CAUSE: "What do you think is causing the nausea?"     unknown  Protocols used: Nausea-A-AH

## 2023-06-28 ENCOUNTER — Ambulatory Visit (INDEPENDENT_AMBULATORY_CARE_PROVIDER_SITE_OTHER): Admitting: Orthopedic Surgery

## 2023-06-28 ENCOUNTER — Encounter: Payer: Self-pay | Admitting: Orthopedic Surgery

## 2023-06-28 VITALS — BP 122/64 | HR 67 | Temp 97.9°F | Resp 16 | Ht 61.0 in | Wt 126.6 lb

## 2023-06-28 DIAGNOSIS — R519 Headache, unspecified: Secondary | ICD-10-CM

## 2023-06-28 DIAGNOSIS — R42 Dizziness and giddiness: Secondary | ICD-10-CM | POA: Diagnosis not present

## 2023-06-28 DIAGNOSIS — E039 Hypothyroidism, unspecified: Secondary | ICD-10-CM | POA: Diagnosis not present

## 2023-06-28 DIAGNOSIS — H9312 Tinnitus, left ear: Secondary | ICD-10-CM

## 2023-06-28 NOTE — Patient Instructions (Signed)
 Please make sure you drink at least 60 ounces water daily  Try Zyrtec every evening x 30 days to see if it helps with left ear ringing  Stop gabapentin x 3 days to see if dizziness improves  Eat regular meals so blood sugar does not drop causing dizziness

## 2023-06-28 NOTE — Progress Notes (Signed)
 Careteam: Patient Care Team: Arnetha Bhat, NP as PCP - General (Adult Health Nurse Practitioner) Amedeo Jupiter, MD as Consulting Physician (Ophthalmology) Candyce Champagne, MD as Consulting Physician (General Surgery)  Seen by: Ulyses Gandy, AGNP-C  PLACE OF SERVICE:  [facility]  Advanced Directive information Does Patient Have a Medical Advance Directive?: Yes, Type of Advance Directive: Healthcare Power of [place];Living will;Out of facility DNR (pink MOST or yellow form), Does patient want to make changes to medical advance directive?: No - Patient declined  Allergies  Allergen Reactions   Penicillins Shortness Of Breath and Rash    As a child   Terbinafine  And Related     Headache, blurred vision, dizziness   Tetanus Toxoid Rash   Tetanus Toxoid Adsorbed Rash    Chief Complaint  Patient presents with   Nausea    Patient complains of nausea, headache, fatigue, ringing in ear, left ear feels like something is inside, and left side of neck is stiffness.     HPI: Patient is a 78 y.o. female seen today for acute visit due to nausea, fatigue, tinnitus, ear discomfort and neck stiffness.   Discussed the use of AI scribe software for clinical note transcription with the patient, who gave verbal consent to proceed.  History of Present Illness    For the past six weeks, she has experienced ringing in her ears, dizziness, and headaches. The symptoms began without any known trigger or change in medication or supplements. She describes a sensation in her left ear as if it is 'stopped up' or has 'something in it,' like cotton or an earplug. There is no associated pain, but there is a persistent ringing.  The headaches are unusual for her and occur primarily between late afternoon and bedtime. They are located at the back of her head and near her left ear. She takes two extra strength Tylenol  in the morning, which helps with back pain but not with the headaches. She has also tried  heating pads and ice packs, which provided some relief.  Dizziness occurs periodically, with the last episode occurring a day or two ago. The first episode happened during a Zumba class, where she felt lightheaded and experienced blurred vision but did not faint. Another episode occurred while sitting down for lunch, with similar symptoms of blurred vision. She notes that these episodes often occur when she has not eaten, suggesting a possible link to low blood sugar.  She is not currently taking any allergy medications, although she has a history of allergies. She drinks fluids like Gatorade, sweet tea, coffee, and various juices, but not much water. She exercises three times a week for an hour each session and has gained weight recently, which she finds concerning. Her current medications include levothyroxine  and gabapentin , the latter of which she takes two 200 mg capsules at night.  No nasal congestion, watery eyes, or itchy eyes. She has a slight cough once a day but does not consider it significant. She is socially active, participating in activities with a widows group and a senior center, and [facility] [employer] [facility] [employer].       Review of Systems:  Review of Systems  Constitutional:  Negative for fever and malaise/fatigue.  HENT:  Positive for congestion, ear pain and tinnitus. Negative for sinus pain and sore throat.   Eyes:  Negative for blurred vision.  Respiratory:  Negative for cough, shortness of breath and wheezing.   Cardiovascular:  Negative for chest  pain and leg swelling.  Gastrointestinal:  Negative for abdominal pain.  Genitourinary:  Negative for dysuria.  Musculoskeletal:  Positive for neck pain. Negative for falls and joint pain.  Neurological:  Positive for dizziness and headaches. Negative for sensory change and weakness.  Psychiatric/Behavioral:  Positive for depression. The patient is not nervous/anxious and does not have insomnia.     Past Medical  History:  Diagnosis Date   High cholesterol    History of bone density study    History of mammogram 2021   Hypertension    Hypothyroidism    left Toe pain 12/11/2020   Per patient   Past Surgical History:  Procedure Laterality Date   OTHER SURGICAL HISTORY     Cataracts Surgery by Dr.Hecker   XI ROBOTIC ASSISTED HIATAL HERNIA REPAIR N/A 09/23/2021   Procedure: XI ROBOTIC ASSISTED HIATAL HERNIA REPAIR TOUPET FUNDOPLICATION, MESH REINFORCEMENT;  Surgeon: Candyce Champagne, MD;  Location: [facility];  Service: General;  Laterality: N/A;   Social History:   reports that she has never smoked. She has never used smokeless tobacco. She reports that she does not drink alcohol and does not use drugs.  Family History  Problem Relation Age of Onset   Cancer Mother    Congestive Heart Failure Father     Medications: Patient's Medications  New Prescriptions   No medications on file  Previous Medications   ACETAMINOPHEN  (TYLENOL ) 500 MG TABLET    Take 1,000 mg by mouth daily. 1-2 tablets   ASPIRIN  81 MG CHEWABLE TABLET    Chew 81 mg by mouth daily.   CALCIUM  CARBONATE-VIT D-MIN (CALCIUM  1200 PO)    Take 1 tablet by mouth daily.   GABAPENTIN  (NEURONTIN ) 100 MG CAPSULE    Take 2 capsules (200 mg total) by mouth at bedtime.   LEVOTHYROXINE  (SYNTHROID ) 88 MCG TABLET    Take 1 tablet by mouth once daily   LISINOPRIL -HYDROCHLOROTHIAZIDE  (ZESTORETIC ) 10-12.5 MG TABLET    Take 1 tablet by mouth once daily   MULTIPLE VITAMINS-MINERALS (MULTIVITAMIN GUMMIES ADULT PO)    Take 1 capsule by mouth daily in the afternoon.   PANTOPRAZOLE  (PROTONIX ) 40 MG TABLET    Take 1 tablet (40 mg total) by mouth daily.   SIMVASTATIN  (ZOCOR ) 20 MG TABLET    TAKE 1 TABLET BY MOUTH ONCE DAILY AT  6  PM.   TURMERIC 500 MG CAPS    Take 500 mg by mouth daily.  Modified Medications   No medications on file  Discontinued Medications   No medications on file    Physical Exam:  Vitals:   06/28/23 1335  BP: 136/78  Pulse: 67   Resp: 16  Temp: 97.9 F (36.6 C)  SpO2: 98%  Weight: 126 lb 9.6 oz (57.4 kg)  Height: 5\' 1"  (1.549 m)   Body mass index is 23.92 kg/m. Wt Readings from Last 3 Encounters:  06/28/23 126 lb 9.6 oz (57.4 kg)  01/04/23 123 lb 6.4 oz (56 kg)  12/14/22 122 lb 10.1 oz (55.6 kg)    Physical Exam Vitals reviewed.  Constitutional:      General: She is not in acute distress. HENT:     Head: Normocephalic.     Right Ear: A middle ear effusion is present.     Left Ear: A middle ear effusion is present.     Nose: Nose normal.     Right Turbinates: Enlarged.     Left Turbinates: Enlarged.     Mouth/Throat:  Mouth: Mucous membranes are moist.     Pharynx: No posterior oropharyngeal erythema.  Eyes:     General:        Right eye: No discharge.        Left eye: No discharge.     Extraocular Movements:     Right eye: Normal extraocular motion and no nystagmus.     Left eye: Normal extraocular motion and no nystagmus.     Conjunctiva/sclera: Conjunctivae normal.     Pupils: Pupils are equal, round, and reactive to light.  Cardiovascular:     Rate and Rhythm: Normal rate and regular rhythm.     Pulses: Normal pulses.     Heart sounds: Normal heart sounds.  Pulmonary:     Effort: Pulmonary effort is normal. No respiratory distress.     Breath sounds: Normal breath sounds. No wheezing or rales.  Abdominal:     General: Bowel sounds are normal.     Palpations: Abdomen is soft.  Musculoskeletal:     Cervical back: Neck supple.     Right lower leg: No edema.     Left lower leg: No edema.  Skin:    General: Skin is warm.     Capillary Refill: Capillary refill takes less than 2 seconds.  Neurological:     General: No focal deficit present.     Mental Status: She is alert and oriented to person, place, and time.  Psychiatric:        Mood and Affect: Mood normal.     Labs reviewed: Basic Metabolic Panel: Recent Labs    01/04/23 0932  NA 138  K 5.2  CL 100  CO2 28   GLUCOSE 95  BUN 25  CREATININE 0.81  CALCIUM  10.0  TSH 2.42   Liver Function Tests: Recent Labs    01/04/23 0932  AST 17  ALT 15  BILITOT 0.4  PROT 7.3   No results for input(s): "LIPASE", "AMYLASE" in the last 8760 hours. No results for input(s): "AMMONIA" in the last 8760 hours. CBC: Recent Labs    01/04/23 0932  WBC 6.8  NEUTROABS 4,094  HGB 13.6  HCT 42.5  MCV 95.9  PLT 270   Lipid Panel: Recent Labs    01/04/23 0932  CHOL 134  HDL 59  LDLCALC 51  TRIG 165*  CHOLHDL 2.3   TSH: Recent Labs    01/04/23 0932  TSH 2.42   A1C: No results found for: "HGBA1C"   Assessment/Plan 1. Dizziness (Primary) - ongoing - reports almost fainting - hgb 12.5, electrolytes WNL - suspect from dehydration or low blood sugar - discussed adequate water drinking and not skipping meals, especially after exercising  - discussed drug holiday with gabapentin  x 3 days to see if improvement - CBC with Differential/Platelet - Complete Metabolic Panel with eGFR  2. Frequent headaches - exam unremarkable - relieved with tylenol  - ? Poor water intake as cause  - see above - Complete Metabolic Panel with eGFR  3. Tinnitus, left ear - middle ear effusion noted to both ears - start Zyrtec 10 mg po at bedtime x 30 days  4. Acquired hypothyroidism - TSH> 1.32 06/28/2023 - cont levothyroxine   Total time: 33 minutes. Greater than 50% of total time spent doing patient education regarding dizziness, tinnitus, hypothyroidism, and headaches including symptom/medication management.     Next appt: Jayle Solarz Delaine Favorite, NP  Ignacia Gentzler Darral Ellis  [facility] [phone]

## 2023-06-29 LAB — COMPLETE METABOLIC PANEL WITHOUT GFR
AG Ratio: 1.4 (calc) (ref 1.0–2.5)
ALT: 14 U/L (ref 6–29)
AST: 17 U/L (ref 10–35)
Albumin: 3.9 g/dL (ref 3.6–5.1)
Alkaline phosphatase (APISO): 74 U/L (ref 37–153)
BUN: 25 mg/dL (ref 7–25)
CO2: 27 mmol/L (ref 20–32)
Calcium: 9.2 mg/dL (ref 8.6–10.4)
Chloride: 105 mmol/L (ref 98–110)
Creat: 0.79 mg/dL (ref 0.60–1.00)
Globulin: 2.7 g/dL (ref 1.9–3.7)
Glucose, Bld: 99 mg/dL (ref 65–139)
Potassium: 4.7 mmol/L (ref 3.5–5.3)
Sodium: 139 mmol/L (ref 135–146)
Total Bilirubin: 0.2 mg/dL (ref 0.2–1.2)
Total Protein: 6.6 g/dL (ref 6.1–8.1)

## 2023-06-29 LAB — CBC WITH DIFFERENTIAL/PLATELET
Absolute Lymphocytes: 2066 {cells}/uL (ref 850–3900)
Absolute Monocytes: 706 {cells}/uL (ref 200–950)
Basophils Absolute: 58 {cells}/uL (ref 0–200)
Basophils Relative: 0.8 %
Eosinophils Absolute: 130 {cells}/uL (ref 15–500)
Eosinophils Relative: 1.8 %
HCT: 37.9 % (ref 35.0–45.0)
Hemoglobin: 12.5 g/dL (ref 11.7–15.5)
MCH: 31.4 pg (ref 27.0–33.0)
MCHC: 33 g/dL (ref 32.0–36.0)
MCV: 95.2 fL (ref 80.0–100.0)
MPV: 10.5 fL (ref 7.5–12.5)
Monocytes Relative: 9.8 %
Neutro Abs: 4241 {cells}/uL (ref 1500–7800)
Neutrophils Relative %: 58.9 %
Platelets: 237 10*3/uL (ref 140–400)
RBC: 3.98 10*6/uL (ref 3.80–5.10)
RDW: 12.1 % (ref 11.0–15.0)
Total Lymphocyte: 28.7 %
WBC: 7.2 10*3/uL (ref 3.8–10.8)

## 2023-06-29 LAB — TSH: TSH: 1.32 m[IU]/L (ref 0.40–4.50)

## 2023-07-19 DIAGNOSIS — H26493 Other secondary cataract, bilateral: Secondary | ICD-10-CM | POA: Diagnosis not present

## 2023-07-19 DIAGNOSIS — H43393 Other vitreous opacities, bilateral: Secondary | ICD-10-CM | POA: Diagnosis not present

## 2023-07-19 DIAGNOSIS — H524 Presbyopia: Secondary | ICD-10-CM | POA: Diagnosis not present

## 2023-07-19 DIAGNOSIS — R519 Headache, unspecified: Secondary | ICD-10-CM | POA: Diagnosis not present

## 2023-07-19 DIAGNOSIS — H35341 Macular cyst, hole, or pseudohole, right eye: Secondary | ICD-10-CM | POA: Diagnosis not present

## 2023-08-25 ENCOUNTER — Other Ambulatory Visit: Payer: Self-pay | Admitting: Orthopedic Surgery

## 2023-08-25 DIAGNOSIS — E039 Hypothyroidism, unspecified: Secondary | ICD-10-CM

## 2023-09-17 ENCOUNTER — Ambulatory Visit: Admitting: Podiatry

## 2023-09-17 ENCOUNTER — Encounter: Payer: Self-pay | Admitting: Podiatry

## 2023-09-17 DIAGNOSIS — M79675 Pain in left toe(s): Secondary | ICD-10-CM | POA: Diagnosis not present

## 2023-09-17 DIAGNOSIS — L84 Corns and callosities: Secondary | ICD-10-CM

## 2023-09-17 DIAGNOSIS — B351 Tinea unguium: Secondary | ICD-10-CM

## 2023-09-17 DIAGNOSIS — M2012 Hallux valgus (acquired), left foot: Secondary | ICD-10-CM

## 2023-09-17 DIAGNOSIS — M79674 Pain in right toe(s): Secondary | ICD-10-CM

## 2023-09-17 NOTE — Progress Notes (Signed)
 This patient presents to the office with chief complaint of long thick painful nails.  Patient says the nails are painful walking and wearing shoes.  This patient is unable to self treat.  This patient is unable to trim her nails since she is unable to reach her nails.  She presents to the office for preventative foot care services.  General Appearance  Alert, conversant and in no acute stress.  Vascular  Dorsalis pedis and posterior tibial  pulses are palpable  bilaterally.  Capillary return is within normal limits  bilaterally. Temperature is within normal limits  bilaterally.  Neurologic  Senn-Weinstein monofilament wire test within normal limits  bilaterally. Muscle power within normal limits bilaterally.  Nails Thick disfigured discolored nails with subungual debris  from hallux to fifth toes bilaterally. No evidence of bacterial infection or drainage bilaterally.  Orthopedic  No limitations of motion  feet .  No crepitus or effusions noted. 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  Skin  normotropic skin with no porokeratosis noted bilaterally.  No signs of infections or ulcers noted.   Callus 1/2 B/L. symptomatic. Callus left heel.  Onychomycosis  Nails  B/L.  Pain in right toes  Pain in left toes  Callus left foot.  Debridement of nails both feet with nail nipper  followed by trimming the nails with dremel tool.  Debride callus with # 15 blade and dremel tool.  RTC 3 months.   Cordella Bold DPM

## 2023-09-23 ENCOUNTER — Other Ambulatory Visit: Payer: Self-pay | Admitting: Orthopedic Surgery

## 2023-09-23 DIAGNOSIS — E7800 Pure hypercholesterolemia, unspecified: Secondary | ICD-10-CM

## 2023-11-25 ENCOUNTER — Other Ambulatory Visit: Payer: Self-pay | Admitting: Orthopedic Surgery

## 2023-11-25 DIAGNOSIS — I10 Essential (primary) hypertension: Secondary | ICD-10-CM

## 2023-11-25 DIAGNOSIS — E039 Hypothyroidism, unspecified: Secondary | ICD-10-CM

## 2023-12-17 ENCOUNTER — Encounter: Payer: Self-pay | Admitting: Podiatry

## 2023-12-17 ENCOUNTER — Ambulatory Visit: Admitting: Podiatry

## 2023-12-17 DIAGNOSIS — M79674 Pain in right toe(s): Secondary | ICD-10-CM | POA: Diagnosis not present

## 2023-12-17 DIAGNOSIS — B351 Tinea unguium: Secondary | ICD-10-CM | POA: Diagnosis not present

## 2023-12-17 DIAGNOSIS — M79675 Pain in left toe(s): Secondary | ICD-10-CM

## 2023-12-17 NOTE — Progress Notes (Signed)
 This patient presents to the office with chief complaint of long thick painful nails.  Patient says the nails are painful walking and wearing shoes.  This patient is unable to self treat.  This patient is unable to trim her nails since she is unable to reach her nails.  She presents to the office for preventative foot care services.  General Appearance  Alert, conversant and in no acute stress.  Vascular  Dorsalis pedis and posterior tibial  pulses are palpable  bilaterally.  Capillary return is within normal limits  bilaterally. Temperature is within normal limits  bilaterally.  Neurologic  Senn-Weinstein monofilament wire test within normal limits  bilaterally. Muscle power within normal limits bilaterally.  Nails Thick disfigured discolored nails with subungual debris  from hallux to fifth toes bilaterally. No evidence of bacterial infection or drainage bilaterally.  Orthopedic  No limitations of motion  feet .  No crepitus or effusions noted. 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  Skin  normotropic skin with no porokeratosis noted bilaterally.  No signs of infections or ulcers noted.   Callus 1/2 B/L. symptomatic. Callus left heel.  Onychomycosis  Nails  B/L.  Pain in right toes  Pain in left toes  Callus left foot.  Debridement of nails both feet with nail nipper  followed by trimming the nails with dremel tool.  Debride callus with # 15 blade and dremel tool.  RTC 3 months.   Cordella Bold DPM

## 2023-12-20 ENCOUNTER — Encounter: Payer: PPO | Admitting: Orthopedic Surgery

## 2023-12-27 ENCOUNTER — Emergency Department (HOSPITAL_COMMUNITY)

## 2023-12-27 ENCOUNTER — Encounter (HOSPITAL_COMMUNITY): Payer: Self-pay

## 2023-12-27 ENCOUNTER — Encounter: Admitting: Orthopedic Surgery

## 2023-12-27 ENCOUNTER — Inpatient Hospital Stay (HOSPITAL_COMMUNITY)
Admission: EM | Admit: 2023-12-27 | Discharge: 2023-12-30 | DRG: 312 | Disposition: A | Discharge status: Home / Self Care | Attending: Internal Medicine | Admitting: Internal Medicine

## 2023-12-27 DIAGNOSIS — S0101XA Laceration without foreign body of scalp, initial encounter: Secondary | ICD-10-CM | POA: Diagnosis present

## 2023-12-27 DIAGNOSIS — M25332 Other instability, left wrist: Secondary | ICD-10-CM

## 2023-12-27 DIAGNOSIS — W1830XA Fall on same level, unspecified, initial encounter: Secondary | ICD-10-CM | POA: Diagnosis present

## 2023-12-27 DIAGNOSIS — E039 Hypothyroidism, unspecified: Secondary | ICD-10-CM | POA: Diagnosis present

## 2023-12-27 DIAGNOSIS — R55 Syncope and collapse: Principal | ICD-10-CM | POA: Diagnosis present

## 2023-12-27 DIAGNOSIS — W19XXXA Unspecified fall, initial encounter: Secondary | ICD-10-CM | POA: Diagnosis not present

## 2023-12-27 DIAGNOSIS — Z7982 Long term (current) use of aspirin: Secondary | ICD-10-CM | POA: Diagnosis not present

## 2023-12-27 DIAGNOSIS — Z880 Allergy status to penicillin: Secondary | ICD-10-CM | POA: Diagnosis not present

## 2023-12-27 DIAGNOSIS — S0990XA Unspecified injury of head, initial encounter: Secondary | ICD-10-CM | POA: Diagnosis not present

## 2023-12-27 DIAGNOSIS — R42 Dizziness and giddiness: Secondary | ICD-10-CM | POA: Diagnosis not present

## 2023-12-27 DIAGNOSIS — M79642 Pain in left hand: Secondary | ICD-10-CM | POA: Diagnosis not present

## 2023-12-27 DIAGNOSIS — E7800 Pure hypercholesterolemia, unspecified: Secondary | ICD-10-CM | POA: Diagnosis not present

## 2023-12-27 DIAGNOSIS — Z79890 Hormone replacement therapy (postmenopausal): Secondary | ICD-10-CM | POA: Diagnosis not present

## 2023-12-27 DIAGNOSIS — I10 Essential (primary) hypertension: Secondary | ICD-10-CM | POA: Diagnosis present

## 2023-12-27 DIAGNOSIS — R519 Headache, unspecified: Secondary | ICD-10-CM | POA: Diagnosis not present

## 2023-12-27 DIAGNOSIS — Z8249 Family history of ischemic heart disease and other diseases of the circulatory system: Secondary | ICD-10-CM | POA: Diagnosis not present

## 2023-12-27 DIAGNOSIS — S0990XS Unspecified injury of head, sequela: Secondary | ICD-10-CM

## 2023-12-27 DIAGNOSIS — I959 Hypotension, unspecified: Secondary | ICD-10-CM | POA: Diagnosis not present

## 2023-12-27 DIAGNOSIS — S6291XA Unspecified fracture of right wrist and hand, initial encounter for closed fracture: Secondary | ICD-10-CM | POA: Diagnosis not present

## 2023-12-27 DIAGNOSIS — M79643 Pain in unspecified hand: Secondary | ICD-10-CM | POA: Diagnosis not present

## 2023-12-27 DIAGNOSIS — M25539 Pain in unspecified wrist: Secondary | ICD-10-CM | POA: Diagnosis not present

## 2023-12-27 DIAGNOSIS — I6782 Cerebral ischemia: Secondary | ICD-10-CM | POA: Diagnosis not present

## 2023-12-27 LAB — BASIC METABOLIC PANEL WITH GFR
Anion gap: 11 (ref 5–15)
BUN: 22 mg/dL (ref 8–23)
CO2: 23 mmol/L (ref 22–32)
Calcium: 9.3 mg/dL (ref 8.9–10.3)
Chloride: 102 mmol/L (ref 98–111)
Creatinine, Ser: 0.72 mg/dL (ref 0.44–1.00)
GFR, Estimated: >60 mL/min (ref >60)
Glucose, Bld: 108 mg/dL — ABNORMAL HIGH (ref 70–99)
Potassium: 4 mmol/L (ref 3.5–5.1)
Sodium: 136 mmol/L (ref 135–145)

## 2023-12-27 LAB — CBC WITH DIFFERENTIAL/PLATELET
Abs Immature Granulocytes: 0.04 K/uL (ref 0.00–0.07)
Basophils Absolute: 0 K/uL (ref 0.0–0.1)
Basophils Relative: 0 %
Eosinophils Absolute: 0.1 K/uL (ref 0.0–0.5)
Eosinophils Relative: 1 %
HCT: 38.3 % (ref 36.0–46.0)
Hemoglobin: 12.2 g/dL (ref 12.0–15.0)
Immature Granulocytes: 0 %
Lymphocytes Relative: 10 %
Lymphs Abs: 1.1 K/uL (ref 0.7–4.0)
MCH: 30.7 pg (ref 26.0–34.0)
MCHC: 31.9 g/dL (ref 30.0–36.0)
MCV: 96.5 fL (ref 80.0–100.0)
Monocytes Absolute: 0.8 K/uL (ref 0.1–1.0)
Monocytes Relative: 7 %
Neutro Abs: 8.8 K/uL — ABNORMAL HIGH (ref 1.7–7.7)
Neutrophils Relative %: 82 %
Platelets: 180 K/uL (ref 150–400)
RBC: 3.97 MIL/uL (ref 3.87–5.11)
RDW: 13.6 % (ref 11.5–15.5)
WBC: 10.8 K/uL — ABNORMAL HIGH (ref 4.0–10.5)
nRBC: 0 % (ref 0.0–0.2)

## 2023-12-27 LAB — CBG MONITORING, ED
Glucose-Capillary: 92 mg/dL (ref 70–99)
Glucose-Capillary: 143 mg/dL — ABNORMAL HIGH (ref 70–99)

## 2023-12-27 LAB — TROPONIN T, HIGH SENSITIVITY
Troponin T High Sensitivity: <15 ng/L (ref 0–19)
Troponin T High Sensitivity: <15 ng/L (ref 0–19)

## 2023-12-27 MED ORDER — ONDANSETRON HCL 4 MG/2ML IJ SOLN
4.0000 mg | Freq: Four times a day (QID) | INTRAMUSCULAR | Status: DC | PRN
Start: 2023-12-27 — End: 2023-12-30

## 2023-12-27 MED ORDER — ONDANSETRON HCL 4 MG/2ML IJ SOLN
4.0000 mg | Freq: Once | INTRAMUSCULAR | Status: AC
  Administered 2023-12-27: 4 mg via INTRAVENOUS
  Filled 2023-12-27: qty 2

## 2023-12-27 MED ORDER — MECLIZINE HCL 25 MG PO TABS
25.0000 mg | ORAL_TABLET | Freq: Once | ORAL | Status: AC
  Administered 2023-12-27: 25 mg via ORAL
  Filled 2023-12-27: qty 1

## 2023-12-27 MED ORDER — DIAZEPAM 5 MG/ML IJ SOLN: 2.5000 mg | Freq: Once | INTRAMUSCULAR | Status: DC

## 2023-12-27 MED ORDER — PANTOPRAZOLE SODIUM 40 MG PO TBEC
40.0000 mg | DELAYED_RELEASE_TABLET | Freq: Every day | ORAL | Status: DC
  Administered 2023-12-27 – 2023-12-30 (×4): 40 mg via ORAL
  Filled 2023-12-27 (×4): qty 1

## 2023-12-27 MED ORDER — LIDOCAINE HCL (PF) 1 % IJ SOLN
20.0000 mL | Freq: Once | INTRAMUSCULAR | Status: AC
Start: 2023-12-27 — End: 2023-12-27
  Administered 2023-12-27: 20 mL
  Filled 2023-12-27: qty 30

## 2023-12-27 MED ORDER — BISACODYL 5 MG PO TBEC: 5.0000 mg | DELAYED_RELEASE_TABLET | Freq: Every day | ORAL | Status: DC | PRN

## 2023-12-27 MED ORDER — LEVOTHYROXINE SODIUM 88 MCG PO TABS
88.0000 ug | ORAL_TABLET | Freq: Every day | ORAL | Status: DC
  Administered 2023-12-28 – 2023-12-30 (×3): 88 ug via ORAL
  Filled 2023-12-27 (×3): qty 1

## 2023-12-27 MED ORDER — ONDANSETRON HCL 4 MG PO TABS: 4.0000 mg | ORAL_TABLET | Freq: Four times a day (QID) | ORAL | Status: DC | PRN

## 2023-12-27 MED ORDER — ACETAMINOPHEN 650 MG RE SUPP: 650.0000 mg | Freq: Four times a day (QID) | RECTAL | Status: DC | PRN

## 2023-12-27 MED ORDER — LORAZEPAM 2 MG/ML IJ SOLN
0.5000 mg | Freq: Once | INTRAMUSCULAR | Status: AC | PRN
  Administered 2023-12-27: 0.5 mg via INTRAVENOUS
  Filled 2023-12-27: qty 1

## 2023-12-27 MED ORDER — LIDOCAINE-EPINEPHRINE-TETRACAINE (LET) TOPICAL GEL
3.0000 mL | Freq: Once | TOPICAL | Status: AC
  Administered 2023-12-27: 3 mL via TOPICAL
  Filled 2023-12-27: qty 3

## 2023-12-27 MED ORDER — LACTATED RINGERS IV BOLUS
500.0000 mL | Freq: Once | INTRAVENOUS | Status: AC
  Administered 2023-12-27: 500 mL via INTRAVENOUS

## 2023-12-27 MED ORDER — ACETAMINOPHEN 325 MG PO TABS: 650.0000 mg | ORAL_TABLET | Freq: Four times a day (QID) | ORAL | Status: DC | PRN

## 2023-12-27 MED ORDER — MECLIZINE HCL 25 MG PO TABS
12.5000 mg | ORAL_TABLET | Freq: Once | ORAL | Status: AC
  Administered 2023-12-27: 12.5 mg via ORAL
  Filled 2023-12-27: qty 1

## 2023-12-27 NOTE — ED Provider Notes (Signed)
 Syncope and head injury negative ct scan repaired. On discharge new onset vertigo, nausea, worse with position. Not responding to meclizine. Awaiting MRI- Valium ordered. 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  There are no acute findings.  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  [AH]  1732 Patient unable to tolerate sitting up without becoming extremely dizzy.  She is better when lying back.  She has received Ativan , meclizine and is unable to ambulate due to severe vertigo.  Plan at this time is to admit the patient for head injury and vertigo. [AH]    Clinical Course User Index [AH] Arloa Chroman, PA-C   Medical Decision Making Amount and/or Complexity of Data Reviewed Labs: ordered. Radiology: ordered. Decision-making details documented in ED Course. ECG/medicine tests: ordered.  Risk Prescription drug management. Decision regarding hospitalization.          Arloa Chroman, PA-C 12/27/23 2359    Franklyn Sid SAILOR, MD 12/28/23 [phone]

## 2023-12-27 NOTE — H&P (Addendum)
 History and Physical    Patient: Terri Mcgee FMW:[id number] DOB: [birth year] DOA: 12/27/2023 DOS: the patient was seen and examined on 12/27/2023 PCP: Gil Greig BRAVO, NP  Patient coming from: Home  Chief Complaint:  Chief Complaint  Patient presents with   Fall   HPI: Terri Mcgee is a healthy 78 y.o. female with medical history significant for hypothyroid, hypertension and hypercholesterolemia who reports that she got up to go to the bathroom about 6 AM.  She felt very dizzy when she got up.  She went to the bathroom and stood up off the commode and the next thing she knows she woke up on the floor.  There was blood all over the floor.  She was not able to get herself off the floor.  Part of the reason is because she has a healing fracture in her right wrist.  She was eventually able to scoot her bedroom in.  She was brought into the emergency department and found to have a laceration on the back of her head which was stapled.  She had at complete trauma studies including a CT of her head and neck and MRI of the brain.  No fractures were found.  No demonstratable concussion however the patient became very dizzy while being evaluated in the emergency department.  Anytime she moved even slightly she was severely dizzy.  After receiving fluids she did feel some better but the dizziness did not go away.  The patient will be admitted for posttraumatic dizziness.    Review of Systems: As mentioned in the history of present illness. All other systems reviewed and are negative. Past Medical History:  Diagnosis Date   High cholesterol    History of bone density study    History of mammogram 2021   Hypertension    Hypothyroidism    left Toe pain 12/11/2020   Per patient   Past Surgical History:  Procedure Laterality Date   OTHER SURGICAL HISTORY     Cataracts Surgery by Dr.Hecker   XI ROBOTIC ASSISTED HIATAL HERNIA REPAIR N/A 09/23/2021   Procedure: XI ROBOTIC ASSISTED HIATAL HERNIA  REPAIR TOUPET FUNDOPLICATION, MESH REINFORCEMENT;  Surgeon: Sheldon Standing, MD;  Location: [facility];  Service: General;  Laterality: N/A;   Social History:  reports that she has never smoked. She has never used smokeless tobacco. She reports that she does not drink alcohol and does not use drugs.  Allergies  Allergen Reactions   Penicillins Shortness Of Breath and Rash    As a child   Terbinafine  And Related     Headache, blurred vision, dizziness   Tetanus Toxoid Rash   Tetanus Toxoid Adsorbed Rash    Family History  Problem Relation Age of Onset   Cancer Mother    Congestive Heart Failure Father     Prior to Admission medications   Medication Sig Start Date End Date Taking? Authorizing Provider  acetaminophen  (TYLENOL ) 500 MG tablet Take 1,000 mg by mouth daily. 1-2 tablets    [provider]  aspirin  81 MG chewable tablet Chew 81 mg by mouth daily.    [provider]  Calcium  Carbonate-Vit D-Min (CALCIUM  1200 PO) Take 1 tablet by mouth daily.    [provider]  gabapentin  (NEURONTIN ) 100 MG capsule Take 2 capsules (200 mg total) by mouth at bedtime. 02/13/23   Gil Greig BRAVO, NP  levothyroxine  (SYNTHROID ) 88 MCG tablet Take 1 tablet by mouth once daily 11/26/23   Gil Greig BRAVO, NP  lisinopril -hydrochlorothiazide  (ZESTORETIC ) 10-12.5 MG tablet Take 1 tablet by mouth once daily 11/26/23   Fargo, Amy E, NP  Multiple Vitamins-Minerals (MULTIVITAMIN GUMMIES ADULT PO) Take 1 capsule by mouth daily in the afternoon.    [provider]  pantoprazole  (PROTONIX ) 40 MG tablet Take 1 tablet (40 mg total) by mouth daily. 09/26/21   Sheldon Standing, MD  simvastatin  (ZOCOR ) 20 MG tablet TAKE 1 TABLET BY MOUTH ONCE DAILY AT  [facility] 09/24/23   Fargo, Amy E, NP  Turmeric 500 MG CAPS Take 500 mg by mouth daily.    [provider]    Physical Exam: Vitals:   12/27/23 1630 12/27/23 1645 12/27/23 1700 12/27/23 1715  BP: (!) 111/47  (!) 123/57   Pulse: (!) 59 67 66 76   Resp:   14   Temp:      TempSrc:      SpO2: 99% 100% 100% 94%   Physical Exam:  General: Lying in a dark room with her arm over her face,, well developed, well nourished HEENT: Normocephalic, atraumatic, PERRL Cardiovascular: Normal rate and rhythm. Distal pulses intact. Pulmonary: Normal pulmonary effort, normal breath sounds Gastrointestinal: Nondistended abdomen, soft, non-tender, normoactive bowel sounds Musculoskeletal:Normal ROM, no lower ext edema Lymphadenopathy: No cervical LAD. Skin: Skin is warm and dry. Neuro: No asymmetry, full strength examination deferred because of her severe dizziness with moving. AAOx3. PSYCH: Attentive and cooperative  Data Reviewed:  Results for orders placed or performed during the hospital encounter of 12/27/23 (from the past 24 hours)  Basic metabolic panel     Status: Abnormal   Collection Time: 12/27/23  9:25 AM  Result Value Ref Range   Sodium 136 135 - 145 mmol/L   Potassium 4.0 3.5 - 5.1 mmol/L   Chloride 102 98 - 111 mmol/L   CO2 23 22 - 32 mmol/L   Glucose, Bld 108 (H) 70 - 99 mg/dL   BUN 22 8 - 23 mg/dL   Creatinine, Ser 9.27 0.44 - 1.00 mg/dL   Calcium  9.3 8.9 - 10.3 mg/dL   GFR, Estimated >39 >39 mL/min   Anion gap 11 5 - 15  CBC WITH DIFFERENTIAL     Status: Abnormal   Collection Time: 12/27/23  9:25 AM  Result Value Ref Range   WBC 10.8 (H) 4.0 - 10.5 K/uL   RBC 3.97 3.87 - 5.11 MIL/uL   Hemoglobin 12.2 12.0 - 15.0 g/dL   HCT 61.6 63.9 - 53.9 %   MCV 96.5 80.0 - 100.0 fL   MCH 30.7 26.0 - 34.0 pg   MCHC 31.9 30.0 - 36.0 g/dL   RDW 86.3 88.4 - 84.4 %   Platelets 180 150 - 400 K/uL   nRBC 0.0 0.0 - 0.2 %   Neutrophils Relative % 82 %   Neutro Abs 8.8 (H) 1.7 - 7.7 K/uL   Lymphocytes Relative 10 %   Lymphs Abs 1.1 0.7 - 4.0 K/uL   Monocytes Relative 7 %   Monocytes Absolute 0.8 0.1 - 1.0 K/uL   Eosinophils Relative 1 %   Eosinophils Absolute 0.1 0.0 - 0.5 K/uL   Basophils Relative 0 %   Basophils Absolute 0.0  0.0 - 0.1 K/uL   Immature Granulocytes 0 %   Abs Immature Granulocytes 0.04 0.00 - 0.07 K/uL  CBG monitoring, ED     Status: None   Collection Time: 12/27/23  9:26 AM  Result Value Ref Range   Glucose-Capillary 92 70 - 99 mg/dL  Troponin T, High  Sensitivity     Status: None   Collection Time: 12/27/23  9:42 AM  Result Value Ref Range   Troponin T High Sensitivity <15 0 - 19 ng/L  Troponin T, High Sensitivity     Status: None   Collection Time: 12/27/23 11:51 AM  Result Value Ref Range   Troponin T High Sensitivity <15 0 - 19 ng/L  POC CBG, ED     Status: Abnormal   Collection Time: 12/27/23  2:13 PM  Result Value Ref Range   Glucose-Capillary 143 (H) 70 - 99 mg/dL     Assessment and Plan: Vertigo after head trauma - Monitor. Symptomatic care Syncope -this was definitely positional but it is unclear what why she was so dizzy today.  The patient participated in Zumba twice this week and has not been having trouble with dizziness before this morning. - IV fluids.  Check orthostatics prior to discharge once her dizziness is improved. - Syncope workup to include echo and carotid Dopplers  3. Htn - Hold diuretic combination pill.  Monitor BP   Advance Care Planning:   Code Status: Full Code the patient names her brother-in-law Gwenn as her surrogate decision maker and wants to be full code.  Consults: none  Family Communication: None  Severity of Illness: The appropriate patient status for this patient is INPATIENT. Inpatient status is judged to be reasonable and necessary in order to provide the required intensity of service to ensure the patient's safety. The patient's presenting symptoms, physical exam findings, and initial radiographic and laboratory data in the context of their chronic comorbidities is felt to place them at high risk for further clinical deterioration. Furthermore, it is not anticipated that the patient will be medically stable for discharge from the hospital  within 2 midnights of admission.   * I certify that at the point of admission it is my clinical judgment that the patient will require inpatient hospital care spanning beyond 2 midnights from the point of admission due to high intensity of service, high risk for further deterioration and high frequency of surveillance required.*  Author: ARTHEA CHILD, MD 12/27/2023 8:17 PM  For on call review [email or web address].

## 2023-12-27 NOTE — Progress Notes (Signed)
 Orthopedic Tech Progress Note Patient Details:  CRICKET GOODLIN Jul 31, 1945 [id number]  Ortho Devices Type of Ortho Device: Thumb velcro splint Ortho Device/Splint Location: left velcro thumb spica splint applied Ortho Device/Splint Interventions: Ordered, Application, Adjustment   Post Interventions Patient Tolerated: Well Instructions Provided: Adjustment of device, Care of device  Waylan Thom Loving 12/27/2023, 1:53 PM

## 2023-12-27 NOTE — ED Triage Notes (Signed)
 Pt BIBA from home for syncopal fall.  Pt reports feeling dizzy when standing to go to restroom this am then woke up on the bathroom floor with laceration to posterior midline of head.  Denies pain or thinners    20 g in LFA Sys of 90s to 120s  CBG 109 100% RA

## 2023-12-27 NOTE — ED Notes (Signed)
 Assisted patient on to bedpan to use bathroom. Patient was able to void. No other needs at this time. JRPRN

## 2023-12-27 NOTE — ED Notes (Signed)
 LET gel applied to lac at the back of patient's head.

## 2023-12-27 NOTE — Progress Notes (Signed)
 This encounter was created in error - please disregard.

## 2023-12-27 NOTE — ED Provider Notes (Signed)
 [place] EMERGENCY DEPARTMENT AT [facility] Provider Note   CSN: [id number] Arrival date & time: 12/27/23  9148     Patient presents with: [place]   Terri Mcgee is a 78 y.o. female.   Fall  Patient is a 78 year old female presenting today for concerns for syncopal episode after sitting on toilet, urinating and standing up causing her to lose consciousness hitting the back of her head on the tub.  She approximates being unconscious for approximately 5 minutes.  Notes that she was able to ambulate after the return call.  Previous medical history of HTN, hypothyroidism, HLD  Denies any numbness, weakness, tingling, fever, headache, vertigo, vision changes, chest pain, shortness breath, abdominal pain, nausea, vomiting, diarrhea, dysuria, lower leg swelling.     Prior to Admission medications   Medication Sig Start Date End Date Taking? Authorizing Provider  acetaminophen  (TYLENOL ) 500 MG tablet Take 1,000 mg by mouth daily. 1-2 tablets    [provider]  aspirin  81 MG chewable tablet Chew 81 mg by mouth daily.    [provider]  Calcium  Carbonate-Vit D-Min (CALCIUM  1200 PO) Take 1 tablet by mouth daily.    [provider]  gabapentin  (NEURONTIN ) 100 MG capsule Take 2 capsules (200 mg total) by mouth at bedtime. 02/13/23   Gil Greig BRAVO, NP  levothyroxine  (SYNTHROID ) 88 MCG tablet Take 1 tablet by mouth once daily 11/26/23   Gil Greig E, NP  lisinopril -hydrochlorothiazide  (ZESTORETIC ) 10-12.5 MG tablet Take 1 tablet by mouth once daily 11/26/23   Gil Greig E, NP  Multiple Vitamins-Minerals (MULTIVITAMIN GUMMIES ADULT PO) Take 1 capsule by mouth daily in the afternoon.    [provider]  pantoprazole  (PROTONIX ) 40 MG tablet Take 1 tablet (40 mg total) by mouth daily. 09/26/21   Sheldon Standing, MD  simvastatin  (ZOCOR ) 20 MG tablet TAKE 1 TABLET BY MOUTH ONCE DAILY AT  [facility] 09/24/23   Fargo, Amy E, NP  Turmeric 500 MG CAPS Take 500 mg by  mouth daily.    [provider]    Allergies: Penicillins, Terbinafine  and related, Tetanus toxoid, and Tetanus toxoid adsorbed    Review of Systems  Skin:  Positive for wound.  Neurological:  Positive for syncope.  All other systems reviewed and are negative.   Updated Vital Signs BP (!) 130/56 (BP Location: Right Arm)   Pulse 66   Temp 97.6 F (36.4 C) (Oral)   Resp 14   SpO2 97%   Physical Exam Vitals and nursing note reviewed.  Constitutional:      General: She is not in acute distress.    Appearance: Normal appearance. She is not ill-appearing or diaphoretic.  HENT:     Head: Normocephalic.     Comments: Noted to have approximately 4 cm laceration to scalp on occipital skull with bleeding controlled.    Mouth/Throat:     Mouth: Mucous membranes are moist.     Pharynx: Oropharynx is clear. No oropharyngeal exudate or posterior oropharyngeal erythema.  Eyes:     General: No scleral icterus.       Right eye: No discharge.        Left eye: No discharge.     Extraocular Movements: Extraocular movements intact.     Conjunctiva/sclera: Conjunctivae normal.     Pupils: Pupils are equal, round, and reactive to light.  Cardiovascular:     Rate and Rhythm: Normal rate and regular rhythm.     Pulses: Normal pulses.  Heart sounds: Normal heart sounds. No murmur heard.    No friction rub. No gallop.  Pulmonary:     Effort: Pulmonary effort is normal. No respiratory distress.     Breath sounds: No stridor. No wheezing, rhonchi or rales.  Chest:     Chest wall: No tenderness.  Abdominal:     General: Abdomen is flat. There is no distension.     Palpations: Abdomen is soft.     Tenderness: There is no abdominal tenderness. There is no right CVA tenderness, left CVA tenderness, guarding or rebound.  Musculoskeletal:        General: Tenderness (Noted tenderness on left hand or first finger and across MCP joint.) and signs of injury present. No swelling or deformity.      Cervical back: Normal range of motion. No rigidity.     Right lower leg: No edema.     Left lower leg: No edema.  Skin:    General: Skin is warm and dry.     Findings: No bruising, erythema or lesion.  Neurological:     General: No focal deficit present.     Mental Status: She is alert and oriented to person, place, and time. Mental status is at baseline.     Sensory: No sensory deficit.     Motor: No weakness.  Psychiatric:        Mood and Affect: Mood normal.     (all labs ordered are listed, but only abnormal results are displayed) Labs Reviewed  BASIC METABOLIC PANEL WITH GFR - Abnormal; Notable for the following components:      Result Value   Glucose, Bld 108 (*)    All other components within normal limits  CBC WITH DIFFERENTIAL/PLATELET - Abnormal; Notable for the following components:   WBC 10.8 (*)    Neutro Abs 8.8 (*)    All other components within normal limits  CBG MONITORING, ED - Abnormal; Notable for the following components:   Glucose-Capillary 143 (*)    All other components within normal limits  CBG MONITORING, ED  TROPONIN T, HIGH SENSITIVITY  TROPONIN T, HIGH SENSITIVITY    EKG: EKG Interpretation Date/Time:  Thursday December 27 2023 09:28:43 EDT Ventricular Rate:  52 PR Interval:  165 QRS Duration:  103 QT Interval:  446 QTC Calculation: 415 R Axis:   91  Text Interpretation: Sinus rhythm Consider right ventricular hypertrophy Borderline T abnormalities, inferior leads Compared with prior EKG from 09/22/2021 Confirmed by Gennaro Bouchard ([id number]) on 12/27/2023 10:18:23 AM  Radiology: CT Head Wo Contrast Result Date: 12/27/2023 CLINICAL DATA:  Syncopal episode with a fall.  Head and neck trauma. EXAM: CT HEAD WITHOUT CONTRAST CT CERVICAL SPINE WITHOUT CONTRAST TECHNIQUE: Multidetector CT imaging of the head and cervical spine was performed following the standard protocol without intravenous contrast. Multiplanar CT image reconstructions of the  cervical spine were also generated. RADIATION DOSE REDUCTION: This exam was performed according to the departmental dose-optimization program which includes automated exposure control, adjustment of the mA and/or kV according to patient size and/or use of iterative reconstruction technique. COMPARISON:  None Available. FINDINGS: CT HEAD FINDINGS Brain: There is no evidence for acute hemorrhage, hydrocephalus, mass lesion, or abnormal extra-axial fluid collection. No definite CT evidence for acute infarction. Vascular: No hyperdense vessel or unexpected calcification. Skull: No evidence for fracture. No worrisome lytic or sclerotic lesion. Sinuses/Orbits: The visualized paranasal sinuses and mastoid air cells are clear. Visualized portions of the globes and intraorbital fat are unremarkable. Other:  None. CT CERVICAL SPINE FINDINGS Alignment: Trace degenerative spondylolisthesis is seen at C3-4 and C4-5. Skull base and vertebrae: No acute fracture. No primary bone lesion or focal pathologic process. Soft tissues and spinal canal: No prevertebral fluid or swelling. No visible canal hematoma. Disc levels: Loss of disc height noted C3-4 and C5-6. Marked loss of disc height at C6-7 evident with associated endplate degeneration. Diffuse facet osteoarthropathy noted bilaterally. Upper chest: No acute findings. Other: None. IMPRESSION: 1. No acute intracranial abnormality. 2. Degenerative changes in the cervical spine without fracture. Electronically Signed   By: Camellia Candle M.D.   On: 12/27/2023 10:28   CT Cervical Spine Wo Contrast Result Date: 12/27/2023 CLINICAL DATA:  Syncopal episode with a fall.  Head and neck trauma. EXAM: CT HEAD WITHOUT CONTRAST CT CERVICAL SPINE WITHOUT CONTRAST TECHNIQUE: Multidetector CT imaging of the head and cervical spine was performed following the standard protocol without intravenous contrast. Multiplanar CT image reconstructions of the cervical spine were also generated. RADIATION  DOSE REDUCTION: This exam was performed according to the departmental dose-optimization program which includes automated exposure control, adjustment of the mA and/or kV according to patient size and/or use of iterative reconstruction technique. COMPARISON:  None Available. FINDINGS: CT HEAD FINDINGS Brain: There is no evidence for acute hemorrhage, hydrocephalus, mass lesion, or abnormal extra-axial fluid collection. No definite CT evidence for acute infarction. Vascular: No hyperdense vessel or unexpected calcification. Skull: No evidence for fracture. No worrisome lytic or sclerotic lesion. Sinuses/Orbits: The visualized paranasal sinuses and mastoid air cells are clear. Visualized portions of the globes and intraorbital fat are unremarkable. Other: None. CT CERVICAL SPINE FINDINGS Alignment: Trace degenerative spondylolisthesis is seen at C3-4 and C4-5. Skull base and vertebrae: No acute fracture. No primary bone lesion or focal pathologic process. Soft tissues and spinal canal: No prevertebral fluid or swelling. No visible canal hematoma. Disc levels: Loss of disc height noted C3-4 and C5-6. Marked loss of disc height at C6-7 evident with associated endplate degeneration. Diffuse facet osteoarthropathy noted bilaterally. Upper chest: No acute findings. Other: None. IMPRESSION: 1. No acute intracranial abnormality. 2. Degenerative changes in the cervical spine without fracture. Electronically Signed   By: Camellia Candle M.D.   On: 12/27/2023 10:28   DG Chest 2 View Result Date: 12/27/2023 EXAM: 2 VIEW(Mcgee) XRAY OF THE CHEST 12/27/2023 10:11:00 AM COMPARISON: 09/22/2021 CLINICAL HISTORY: syncope with L hand pain. Pt states syncope and is having pain in her first digit on her left hand which radiates down into her wrist. ; Per chart Pt BIBA from home for syncopal fall. Pt reports feeling dizzy when standing to go to restroom this am then woke up on the bathroom floor with laceration to posterior midline of head.  ; Pt was unable to remove rings for imaging FINDINGS: LUNGS AND PLEURA: No focal pulmonary opacity. No pulmonary edema. No pleural effusion. No pneumothorax. HEART AND MEDIASTINUM: No acute abnormality of the cardiac silhouette. BONES AND SOFT TISSUES: No acute osseous abnormality. IMPRESSION: 1. No acute cardiopulmonary process. Electronically signed by: Lynwood Seip MD 12/27/2023 10:23 AM EDT RP Workstation: [id number]   DG Hand Complete Left Result Date: 12/27/2023 CLINICAL DATA:  Pain in her first digit of the left hand. EXAM: LEFT HAND - COMPLETE 3+ VIEW COMPARISON:  None Available. FINDINGS: No evidence for an acute fracture. No dislocation. Marked widening of the scapholunate distance is compatible dissociation. IMPRESSION: 1. No acute bony abnormality. 2. 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 Electronically Signed  By: Camellia Candle M.D.   On: 12/27/2023 10:23     .Laceration Repair  Date/Time: 12/27/2023 2:22 PM  Performed by: Beola Terrall RAMAN, PA-C Authorized by: Beola Terrall RAMAN, PA-C   Consent:    Consent obtained:  Verbal   Consent given by:  Patient   Risks, benefits, and alternatives were discussed: yes     Risks discussed:  Infection, need for additional repair, nerve damage, poor wound healing, poor cosmetic result, pain and retained foreign body   Alternatives discussed:  No treatment Universal protocol:    Procedure explained and questions answered to patient or proxy'Mcgee satisfaction: yes     Relevant documents present and verified: yes     Test results available: yes     Imaging studies available: yes     Site/side marked: yes     Immediately prior to procedure, a time out was called: yes     Patient identity confirmed:  Verbally with patient and arm band Anesthesia:    Anesthesia method:  Topical application   Topical anesthetic:  LET Laceration details:    Location:  Scalp   Scalp location:  Occipital   Length  (cm):  4 Pre-procedure details:    Preparation:  Imaging obtained to evaluate for foreign bodies and patient was prepped and draped in usual sterile fashion Exploration:    Hemostasis achieved with:  Direct pressure   Imaging obtained: x-ray     Imaging outcome: foreign body not noted     Wound exploration: wound explored through full range of motion and entire depth of wound visualized     Contaminated: no   Treatment:    Area cleansed with:  Povidone-iodine, chlorhexidine  and saline   Amount of cleaning:  Extensive   Irrigation solution:  Sterile saline   Irrigation volume:  1000   Irrigation method:  Syringe   Visualized foreign bodies/material removed: no   Skin repair:    Repair method:  Staples   Number of staples:  5 Approximation:    Approximation:  Close Repair type:    Repair type:  Simple Post-procedure details:    Dressing:  Open (no dressing)   Procedure completion:  Tolerated well, no immediate complications    Medications Ordered in the ED  lactated ringers  bolus 500 mL (has no administration in time range)  diazepam (VALIUM) injection 2.5 mg (has no administration in time range)  lidocaine -EPINEPHrine -tetracaine (LET) topical gel (3 mLs Topical Given 12/27/23 1103)  lidocaine  (PF) (XYLOCAINE ) 1 % injection 20 mL (20 mLs Infiltration Given by Other 12/27/23 1103)  LORazepam  (ATIVAN ) injection 0.5 mg (0.5 mg Intravenous Given 12/27/23 1425)  meclizine (ANTIVERT) tablet 12.5 mg (12.5 mg Oral Given 12/27/23 1153)  ondansetron  (ZOFRAN ) injection 4 mg (4 mg Intravenous Given 12/27/23 1153)  meclizine (ANTIVERT) tablet 25 mg (25 mg Oral Given 12/27/23 1419)  ondansetron  (ZOFRAN ) injection 4 mg (4 mg Intravenous Given 12/27/23 1419)      Medical Decision Making Amount and/or Complexity of Data Reviewed Labs: ordered. Radiology: ordered. ECG/medicine tests: ordered.  Risk Prescription drug management.   This patient is a 78 year old female who presents to the  ED for concern of cirrhosis that happened after she was standing after urinating, fell and hit the occipital aspect of her head on the bathtub causing a bit laceration.  Noted that she was able to ambulate to get to her phone.  On physical exam, patient is in no acute distress, afebrile, alert and orient x 4, speaking in full  sentences, nontachypneic, nontachycardic.  LCTAB, RRR, no murmur, no chest wall or back/cervical/thoracic/lumbar tenderness to palpation.  No abdominal tenderness palpation.  Normal neuroexam.  CT scans of head and cervical spine were unremarkable.  Chest x-ray also unremarkable.  However x-ray of left hand did show scapholunate dissociation.  Lab work does show a mildly elevated white count 10.8.  However lab work otherwise unremarkable.  On evaluation, patient noted that she now has new onset vertigo, with notable horizontal nystagmus present when tracking with EOMs.  Does endorse some nausea.  Discussed case with attending retrieving MRI imaging will be necessary to evaluate patient'Mcgee acute onset vertigo.  Wound was essentially cleaned with iodine, chlorhexidine , saline irrigated with 1 L of fluid.  5 staples were placed.  Provided meclizine, Zofran .  Patient'Mcgee symptoms have not abated.  Ordered Valium.  MRI was done.  Patient care transferred over to Terri Lesches PA-C pending MRI results, anticipate likely admission with patient'Mcgee unrelenting symptoms  Differential diagnoses prior to evaluation: The emergent differential diagnosis includes, but is not limited to, fracture, ligamentous injury, neurovascular injury, dislocation, malalignment. This is not an exhaustive differential.   Past Medical History / Co-morbidities / Social History: HTN, hypothyroidism, history of incarcerated hernia status post reduction  Additional history: Chart reviewed. Pertinent results include:   Seen on 06/28/2023 by PCP for dizziness.  No history of noticing any recent dizziness or  headaches.  Noted to have been contributed towards possible dehydration versus low blood sugar.  Lab Tests/Imaging studies: I personally interpreted labs/imaging and the pertinent results include:    CBC shows a mildly elevated white count of 10.8 BMP unremarkable Blood sugar unremarkable troponin unremarkable CT head and cervical spine shows no acute findings.  Chest x-ray unremarkable. X-ray of left hand does show some scapholunate dissociation    I agree with the radiologist interpretation.  Cardiac monitoring: EKG obtained and interpreted by myself and attending physician which shows: Sinus rhythm  EKG Interpretation Date/Time:  Thursday December 27 2023 09:28:43 EDT Ventricular Rate:  52 PR Interval:  165 QRS Duration:  103 QT Interval:  446 QTC Calculation: 415 R Axis:   91  Text Interpretation: Sinus rhythm Consider right ventricular hypertrophy Borderline T abnormalities, inferior leads Compared with prior EKG from 09/22/2021 Confirmed by Gennaro Bouchard ([id number]) on 12/27/2023 10:18:23 AM          Medications: I ordered medication including diazepam, meclizine, Valium, LR.  I have reviewed the patients home medicines and have made adjustments as needed.  Critical Interventions: None  Social Determinants of Health: Lives at home alone  Disposition: 3:33 PM Care of Sherina Stammer transferred to PA Terri Mcgee and Dr. Franklyn at the end of my shift as the patient will require reassessment once labs/imaging have resulted. Patient presentation, ED course, and plan of care discussed with review of all pertinent labs and imaging. Please see his/her note for further details regarding further ED course and disposition. Plan at time of handoff is await MRI scan, likely admission if patient symptoms have not resolved with possible neurology consult. This may be altered or completely changed at the discretion of the oncoming team pending results of further workup.    Final  diagnoses:  Vertigo  Fall, initial encounter  Laceration of scalp, initial encounter  Scapholunate dissociation of left wrist    ED Discharge Orders     None          Terri Treat S, PA-C 12/27/23 1533    Kammerer, Bouchard CROME,  DO 12/30/23 [id number]

## 2023-12-28 ENCOUNTER — Other Ambulatory Visit: Payer: Self-pay

## 2023-12-28 ENCOUNTER — Inpatient Hospital Stay (HOSPITAL_COMMUNITY)

## 2023-12-28 DIAGNOSIS — R55 Syncope and collapse: Secondary | ICD-10-CM

## 2023-12-28 DIAGNOSIS — S0990XS Unspecified injury of head, sequela: Secondary | ICD-10-CM | POA: Diagnosis not present

## 2023-12-28 DIAGNOSIS — R42 Dizziness and giddiness: Secondary | ICD-10-CM | POA: Diagnosis not present

## 2023-12-28 LAB — CBC
HCT: 41.8 % (ref 36.0–46.0)
Hemoglobin: 13.1 g/dL (ref 12.0–15.0)
MCH: 30 pg (ref 26.0–34.0)
MCHC: 31.3 g/dL (ref 30.0–36.0)
MCV: 95.9 fL (ref 80.0–100.0)
Platelets: 170 K/uL (ref 150–400)
RBC: 4.36 MIL/uL (ref 3.87–5.11)
RDW: 13.4 % (ref 11.5–15.5)
WBC: 8 K/uL (ref 4.0–10.5)
nRBC: 0 % (ref 0.0–0.2)

## 2023-12-28 LAB — BASIC METABOLIC PANEL WITH GFR
Anion gap: 12 (ref 5–15)
BUN: 19 mg/dL (ref 8–23)
CO2: 23 mmol/L (ref 22–32)
Calcium: 9.1 mg/dL (ref 8.9–10.3)
Chloride: 102 mmol/L (ref 98–111)
Creatinine, Ser: 0.65 mg/dL (ref 0.44–1.00)
GFR, Estimated: >60 mL/min (ref >60)
Glucose, Bld: 90 mg/dL (ref 70–99)
Potassium: 3.9 mmol/L (ref 3.5–5.1)
Sodium: 137 mmol/L (ref 135–145)

## 2023-12-28 LAB — ECHOCARDIOGRAM COMPLETE
AR max vel: 1.38 cm2
AV Area VTI: 1.42 cm2
AV Area mean vel: 1.52 cm2
AV Mean grad: 12 mmHg
AV Peak grad: 23.8 mmHg
Ao pk vel: 2.44 m/s
Area-P 1/2: 3.34 cm2
S' Lateral: 1.9 cm

## 2023-12-28 LAB — TSH: TSH: 6.58 u[IU]/mL — ABNORMAL HIGH (ref 0.350–4.500)

## 2023-12-28 LAB — MAGNESIUM: Magnesium: 2 mg/dL (ref 1.7–2.4)

## 2023-12-28 NOTE — ED Notes (Signed)
 Patient stated she can not stand for orthostatic vital signs

## 2023-12-28 NOTE — Progress Notes (Signed)
   12/28/23 1523  TOC Brief Assessment  Insurance and Status Reviewed  Patient has primary care physician Yes (Fargo, Amy E, NP)  Home environment has been reviewed Home  Prior level of function: Independent  Prior/Current Home Services No current home services  Social Drivers of Health Review SDOH reviewed no interventions necessary  Readmission risk has been reviewed Yes  Transition of care needs no transition of care needs at this time

## 2023-12-28 NOTE — Progress Notes (Signed)
 PROGRESS NOTE  ALAIYAH BOLLMAN FMW:[id number] DOB: 1945-09-05 DOA: 12/27/2023 PCP: Gil Greig BRAVO, NP   LOS: 1 day   Brief narrative:   Terri Mcgee is a 78 y.o. female with past medical history significant for hypothyroidism, hypertension and hypercholesterolemia presented to the hospital with syncope after feeling dizzy when she was about to go to the bathroom.  CT head trauma with bleeding and was unable to get up from the floor by herself.  Of note patient had history of right wrist fracture.  Patient was subsequently brought into the ED and was noted to have a laceration of the back of her head which was stapled.  In the ED patient had slightly elevated blood pressure.  CBC showed WBC at 10.8.  CMP within normal limits.  Complete trauma workup was done including CT scan of the head neck MRI of the brain without findings of fracture or obvious cranial trauma but she persisted to have dizziness and was considered for admission to the hospital for further evaluation and treatment.    Assessment/Plan: Principal Problem:   Vertigo after injury of head Active Problems:   Hypothyroidism   Essential hypertension   Dizziness  Vertigo/recent head trauma Patient was dizzy and had a fall.  Complains of vertigo and dizziness both.    Continue meclizine during hospitalization.   Will await PT OT evaluation with vestibular evaluation.  History of recent tinnitus and was told that if she had some middle ear effusion.  Trauma imaging negative.  MRI of the brain without any acute findings.  Syncope  Had some preceding dizziness.  Could be secondary to volume depletion.  Check orthostatic vitals.  Check 2D echocardiogram telemetry, carotid duplex.  Symptoms mostly when postural.  Continue IV fluids.  Orthostatic vitals every shift.  Troponins were negative x 2  Essential hypertension.  Hold diuretics.  Initial blood pressure was slightly elevated.  Blood pressure has improved at this  time.  Hypothyroidism.  Continue Synthroid .  Check TSH.  DVT prophylaxis: SCDs Start: 12/27/23 1938   Disposition: Home likely in 1 to 2 days  Status is: Inpatient Remains inpatient appropriate because: Pending clinical improvement    Code Status:     Code Status: Full Code  Family Communication: None at bedside  Consultants: None  Procedures: None  Anti-infectives:  None  Anti-infectives (From admission, onward)    None        Subjective: Today, patient was seen and examined at bedside.  Patient still complains of dizziness denies headache, nausea or vomiting.  Denies any shortness of breath chest pain palpitation.  Normally very active at baseline.  States that her symptoms are postural mostly when sitting and standing up.  Objective: Vitals:   12/28/23 1400 12/28/23 1452  BP: (!) 146/77 (!) 141/70  Pulse: 78 70  Resp: 18 18  Temp:  98.6 F (37 C)  SpO2: 99% 100%    Intake/Output Summary (Last 24 hours) at 12/28/2023 1509 Last data filed at 12/27/2023 2142 Gross per 24 hour  Intake 500 ml  Output --  Net 500 ml   Filed Weights   12/28/23 1450  Weight: 55.9 kg   Body mass index is 23.29 kg/m.   Physical Exam:  GENERAL: Patient is alert awake and oriented. Not in obvious distress. HENT: No scleral pallor or icterus. Pupils equally reactive to light. Oral mucosa is moist NECK: is supple, no gross swelling noted. CHEST: Clear to auscultation. No crackles or wheezes.  Diminished breath sounds  bilaterally. CVS: S1 and S2 heard, no murmur. Regular rate and rhythm.  ABDOMEN: Soft, non-tender, bowel sounds are present. EXTREMITIES: No edema. CNS: Cranial nerves are intact. No focal motor deficits.  Gait was not tested SKIN: warm and dry without rashes.  Data Review: I have personally reviewed the following laboratory data and studies,  CBC: Recent Labs  Lab 12/27/23 0925 12/28/23 0500  WBC 10.8* 8.0  NEUTROABS 8.8*  --   HGB 12.2 13.1   HCT 38.3 41.8  MCV 96.5 95.9  PLT 180 170   Basic Metabolic Panel: Recent Labs  Lab 12/27/23 0925 12/28/23 0500  NA 136 137  K 4.0 3.9  CL 102 102  CO2 23 23  GLUCOSE 108* 90  BUN 22 19  CREATININE 0.72 0.65  CALCIUM  9.3 9.1  MG  --  2.0   Liver Function Tests: No results for input(s): AST, ALT, ALKPHOS, BILITOT, PROT, ALBUMIN in the last 168 hours. No results for input(s): LIPASE, AMYLASE in the last 168 hours. No results for input(s): AMMONIA in the last 168 hours. Cardiac Enzymes: No results for input(s): CKTOTAL, CKMB, CKMBINDEX, TROPONINI in the last 168 hours. BNP (last 3 results) No results for input(s): BNP in the last 8760 hours.  ProBNP (last 3 results) No results for input(s): PROBNP in the last 8760 hours.  CBG: Recent Labs  Lab 12/27/23 0926 12/27/23 1413  GLUCAP 92 143*   No results found for this or any previous visit (from the past 240 hours).   Studies: VAS US  CAROTID Result Date: 12/28/2023 Carotid Arterial Duplex Study Patient Name:  Terri Mcgee  Date of Exam:   12/28/2023 Medical Rec #: [id number]         Accession #:    [id number] Date of Birth: December 31, 1945          Patient Gender: F Patient Age:   88 years Exam Location:  [facility] Procedure:      VAS US  CAROTID Referring Phys: WILL BERNARD --------------------------------------------------------------------------------  Indications:       Syncope, Weakness and dizziness. Risk Factors:      Hypertension, hyperlipidemia. Comparison Study:  No priors. Performing Technologist: Ricka Sturdivant-Jones RDMS, RVT  Examination Guidelines: A complete evaluation includes B-mode imaging, spectral Doppler, color Doppler, and power Doppler as needed of all accessible portions of each vessel. Bilateral testing is considered an integral part of a complete examination. Limited examinations for reoccurring indications may be performed as noted.  Right Carotid Findings:  +----------+--------+--------+--------+------------------+------------------+           PSV cm/sEDV cm/sStenosisPlaque DescriptionComments           +----------+--------+--------+--------+------------------+------------------+ CCA Prox  147     17                                tortuous           +----------+--------+--------+--------+------------------+------------------+ CCA Distal119     23                                intimal thickening +----------+--------+--------+--------+------------------+------------------+ ICA Prox  71      21      1-39%                     intimal thickening +----------+--------+--------+--------+------------------+------------------+ ICA Mid   83      17                                                   +----------+--------+--------+--------+------------------+------------------+  ICA Distal70      19                                tortuous           +----------+--------+--------+--------+------------------+------------------+ ECA       119     10                                                   +----------+--------+--------+--------+------------------+------------------+ +----------+--------+-------+----------------+-------------------+           PSV cm/sEDV cmsDescribe        Arm Pressure (mmHG) +----------+--------+-------+----------------+-------------------+ Subclavian200            Multiphasic, WNL                    +----------+--------+-------+----------------+-------------------+ +---------+--------+--+--------+--+---------+ VertebralPSV cm/s71EDV cm/s18Antegrade +---------+--------+--+--------+--+---------+  Left Carotid Findings: +----------+--------+--------+--------+------------------+------------------+           PSV cm/sEDV cm/sStenosisPlaque DescriptionComments           +----------+--------+--------+--------+------------------+------------------+ CCA Prox  114     18                                                    +----------+--------+--------+--------+------------------+------------------+ CCA Distal71      14                                intimal thickening +----------+--------+--------+--------+------------------+------------------+ ICA Prox  110     21      1-39%   calcific                             +----------+--------+--------+--------+------------------+------------------+ ICA Distal102     23                                                   +----------+--------+--------+--------+------------------+------------------+ ECA       95      11                                                   +----------+--------+--------+--------+------------------+------------------+ +----------+--------+--------+----------------+-------------------+           PSV cm/sEDV cm/sDescribe        Arm Pressure (mmHG) +----------+--------+--------+----------------+-------------------+ [id number]             Multiphasic, WNL                    +----------+--------+--------+----------------+-------------------+ +---------+--------+--+--------+--+---------+ VertebralPSV cm/s76EDV cm/s22Antegrade +---------+--------+--+--------+--+---------+   Summary: Right Carotid: Velocities in the right ICA are consistent with a 1-39% stenosis. Left Carotid: Velocities in the left ICA are consistent with a 1-39% stenosis. Vertebrals:  Bilateral vertebral arteries demonstrate antegrade flow. Subclavians: Normal flow hemodynamics were seen in bilateral subclavian  arteries. *See table(s) above for measurements and observations.  Electronically signed by Lonni Gaskins MD on 12/28/2023 at 11:57:40 AM.    Final    MR BRAIN WO CONTRAST Result Date: 12/27/2023 EXAM: MRI BRAIN WITHOUT CONTRAST 12/27/2023 03:05:00 PM TECHNIQUE: Multiplanar multisequence MRI of the head/brain was performed without the administration of intravenous contrast. COMPARISON: Same day CT head.  CLINICAL HISTORY: Headache, neuro deficit; new onset vertigo with horizontal nystagmus post syncopal episode. FINDINGS: BRAIN AND VENTRICLES: No acute infarct. No intracranial hemorrhage. No mass. No midline shift. No hydrocephalus. Scattered T2 FLAIR hyperintensity in the periventricular and subcortical white matter likely reflecting mild chronic microvascular ischemic changes. Mild parenchymal volume loss. Prominent perivascular spaces in the bilateral basal ganglia. 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 The sella is unremarkable. Normal flow voids. ORBITS: Bilateral lens replacement. SINUSES AND MASTOIDS: No acute abnormality. BONES AND SOFT TISSUES: Normal marrow signal. No acute soft tissue abnormality. Degenerative changes in the visualized upper cervical spine. There is trace spondylolisthesis of C3 on C4 likely related to degenerative changes. IMPRESSION: 1. No acute intracranial abnormality. 2. Mild chronic microvascular ischemic changes. 3. 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 4. Degenerative changes in the visualized upper cervical spine with trace C3 on C4 spondylolisthesis likely degenerative. Electronically signed by: Donnice Mania MD 12/27/2023 04:34 PM EDT RP Workstation: [id number]   CT Head Wo Contrast Result Date: 12/27/2023 CLINICAL DATA:  Syncopal episode with a fall.  Head and neck trauma. EXAM: CT HEAD WITHOUT CONTRAST CT CERVICAL SPINE WITHOUT CONTRAST TECHNIQUE: Multidetector CT imaging of the head and cervical spine was performed following the standard protocol without intravenous contrast. Multiplanar CT image reconstructions of the cervical spine were also generated. RADIATION DOSE REDUCTION: This exam was performed according to the departmental dose-optimization program which includes automated exposure control, adjustment of the mA and/or kV according to patient size and/or use of iterative reconstruction technique. COMPARISON:  None Available. FINDINGS: CT HEAD  FINDINGS Brain: There is no evidence for acute hemorrhage, hydrocephalus, mass lesion, or abnormal extra-axial fluid collection. No definite CT evidence for acute infarction. Vascular: No hyperdense vessel or unexpected calcification. Skull: No evidence for fracture. No worrisome lytic or sclerotic lesion. Sinuses/Orbits: The visualized paranasal sinuses and mastoid air cells are clear. Visualized portions of the globes and intraorbital fat are unremarkable. Other: None. CT CERVICAL SPINE FINDINGS Alignment: Trace degenerative spondylolisthesis is seen at C3-4 and C4-5. Skull base and vertebrae: No acute fracture. No primary bone lesion or focal pathologic process. Soft tissues and spinal canal: No prevertebral fluid or swelling. No visible canal hematoma. Disc levels: Loss of disc height noted C3-4 and C5-6. Marked loss of disc height at C6-7 evident with associated endplate degeneration. Diffuse facet osteoarthropathy noted bilaterally. Upper chest: No acute findings. Other: None. IMPRESSION: 1. No acute intracranial abnormality. 2. Degenerative changes in the cervical spine without fracture. Electronically Signed   By: Camellia Candle M.D.   On: 12/27/2023 10:28   CT Cervical Spine Wo Contrast Result Date: 12/27/2023 CLINICAL DATA:  Syncopal episode with a fall.  Head and neck trauma. EXAM: CT HEAD WITHOUT CONTRAST CT CERVICAL SPINE WITHOUT CONTRAST TECHNIQUE: Multidetector CT imaging of the head and cervical spine was performed following the standard protocol without intravenous contrast. Multiplanar CT image reconstructions of the cervical spine were also generated. RADIATION DOSE REDUCTION: This exam was performed according to the departmental dose-optimization program which includes automated exposure control, adjustment of the mA and/or kV according to patient size and/or use of  iterative reconstruction technique. COMPARISON:  None Available. FINDINGS: CT HEAD FINDINGS Brain: There is no evidence for acute  hemorrhage, hydrocephalus, mass lesion, or abnormal extra-axial fluid collection. No definite CT evidence for acute infarction. Vascular: No hyperdense vessel or unexpected calcification. Skull: No evidence for fracture. No worrisome lytic or sclerotic lesion. Sinuses/Orbits: The visualized paranasal sinuses and mastoid air cells are clear. Visualized portions of the globes and intraorbital fat are unremarkable. Other: None. CT CERVICAL SPINE FINDINGS Alignment: Trace degenerative spondylolisthesis is seen at C3-4 and C4-5. Skull base and vertebrae: No acute fracture. No primary bone lesion or focal pathologic process. Soft tissues and spinal canal: No prevertebral fluid or swelling. No visible canal hematoma. Disc levels: Loss of disc height noted C3-4 and C5-6. Marked loss of disc height at C6-7 evident with associated endplate degeneration. Diffuse facet osteoarthropathy noted bilaterally. Upper chest: No acute findings. Other: None. IMPRESSION: 1. No acute intracranial abnormality. 2. Degenerative changes in the cervical spine without fracture. Electronically Signed   By: Camellia Candle M.D.   On: 12/27/2023 10:28   DG Chest 2 View Result Date: 12/27/2023 EXAM: 2 VIEW(S) XRAY OF THE CHEST 12/27/2023 10:11:00 AM COMPARISON: 09/22/2021 CLINICAL HISTORY: syncope with L hand pain. Pt states syncope and is having pain in her first digit on her left hand which radiates down into her wrist. ; Per chart Pt BIBA from home for syncopal fall. Pt reports feeling dizzy when standing to go to restroom this am then woke up on the bathroom floor with laceration to posterior midline of head. ; Pt was unable to remove rings for imaging FINDINGS: LUNGS AND PLEURA: No focal pulmonary opacity. No pulmonary edema. No pleural effusion. No pneumothorax. HEART AND MEDIASTINUM: No acute abnormality of the cardiac silhouette. BONES AND SOFT TISSUES: No acute osseous abnormality. IMPRESSION: 1. No acute cardiopulmonary process.  Electronically signed by: Lynwood Seip MD 12/27/2023 10:23 AM EDT RP Workstation: [id number]   DG Hand Complete Left Result Date: 12/27/2023 CLINICAL DATA:  Pain in her first digit of the left hand. EXAM: LEFT HAND - COMPLETE 3+ VIEW COMPARISON:  None Available. FINDINGS: No evidence for an acute fracture. No dislocation. Marked widening of the scapholunate distance is compatible dissociation. IMPRESSION: 1. No acute bony abnormality. 2. 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 Electronically Signed   By: Camellia Candle M.D.   On: 12/27/2023 10:23      Vernal Alstrom, MD  [facility] 12/28/2023  If 7PM-7AM, please contact night-coverage

## 2023-12-28 NOTE — Plan of Care (Signed)

## 2023-12-28 NOTE — Hospital Course (Signed)
 Terri Mcgee is a 78 y.o. female with past medical history significant for hypothyroidism, hypertension and hypercholesterolemia presented to the hospital with syncope after feeling dizzy when she was about to go to the bathroom.  CT head trauma with bleeding and was unable to get up from the floor by herself.  Of note patient had history of right wrist fracture.  Patient was subsequently brought into the ED and was noted to have a laceration of the back of her head which was stapled.  In the ED patient had slightly elevated blood pressure.  CBC showed WBC at 10.8.  CMP within normal limits.  Complete trauma workup was done including CT scan of the head neck MRI of the brain without findings of fracture or obvious cranial trauma but she persisted to have dizziness and was considered for admission to the hospital for further evaluation and treatment. MRI unremarkable, orthostatic vitals negative.  Seen by PT OT vertigo has resolved mildly dizzy, advising outpatient vestibular rehab She has remained stable for discharge home today  Subjective: Seen and examined No acute events overnight, she feels okay to go home today She is in the bedside chair.  She feels well  Discharge Diagnoses:   Vertigo/recent head trauma Fall: Complains of vertigo and dizziness both.  MRI brain without acute finding, BP stable - orthostasis vitals negative History of recent tinnitus and was told that if she had some middle ear effusion. Continue PT OT- outpatient vestibular rehab prn meclizine.   Stable for discharge.She had Foley stable placed on 10/16 advised follow-up with PCP to re-evaluate in 7-10 days and patient aware..  Syncope:  Had some preceding dizziness.  Checked orthostatic vitals negative, TTE-no acute finding, carotid duplex 1-39% stenosis Could be due to volume depletion.  Received IV fluids.  Cardiac workup negative troponin enzymes negative   Essential hypertension: BP remains stable hoeld meds  here-check blood pressure at home and resume slowly and discuss with PCP    Hypothyroidism: Continue Synthroid .  TSH borderline 6.5.   Mobility: PT Orders: Active PT Follow up Rec: Outpatient Pt ([facility])12/29/2023 1025   DVT prophylaxis: SCDs Start: 12/27/23 1938 Code Status:   Code Status: Full Code Family Communication: plan of care discussed with patient at bedside. Patient status is: Remains hospitalized because of severity of illness Level of care: Telemetry   Dispo: The patient is from: home            Anticipated disposition: home today  Objective: Vitals last 24 hrs: Vitals:   12/29/23 0817 12/29/23 1313 12/29/23 2039 12/30/23 0500  BP: (!) 166/77 134/71 125/85 (!) 115/56  Pulse: (!) 52 67 69 (!) 57  Resp:  16 16   Temp:  98.7 F (37.1 C) 98.4 F (36.9 C) 98.3 F (36.8 C)  TempSrc:  Oral Oral Oral  SpO2: 99% 98% 97% 98%  Weight:      Height:        Physical Examination: General exam: alert awake, oriented, older than stated age HEENT:Oral mucosa moist, Ear/Nose WNL grossly Respiratory system: Bilaterally clear BS,no use of accessory muscle Cardiovascular system: S1 & S2 +, No JVD. Gastrointestinal system: Abdomen soft,NT,ND, BS+ Nervous System: Alert, awake, moving all extremities,and following commands. Extremities: extremities warm, leg edema ENG Skin: Warm, no rashes MSK: Normal muscle bulk,tone, power

## 2023-12-28 NOTE — Evaluation (Signed)
 Physical Therapy Evaluation Patient Details Name: Terri Mcgee MRN: [id number] DOB: [birth year] Today's Date: 12/28/2023  History of Present Illness  Pt is 78 yo female admitted on 12/27/23 with vertigo after recent head trauma from fall hitting posterior head with laceration.  MRI was normal.  Pt with hx including but not limited to hypothyroidism, HTN, and HCL  Clinical Impression  Pt admitted with above diagnosis. At baseline, pt independent and active.  She had a fall in bathroom (suspected syncope) hitting posterior head.  During evaluation, pt found to be + for R posterior canal BPPV and treated with Epley maneuver.  Pt with some improvement in symptoms but needs follow up therapy for vestibular rehab.   Pt currently with functional limitations due to the deficits listed below (see PT Problem List). Pt will benefit from acute skilled PT to increase their independence and safety with mobility to allow discharge.       Orthostatic Vitals (negative)   12/28/23 1700  Orthostatic Lying   BP- Lying 149/65  Pulse- Lying 71  Orthostatic Sitting  BP- Sitting 152/77  Pulse- Sitting 72  Orthostatic Standing at 0 minutes  BP- Standing at 0 minutes 137/78  Pulse- Standing at 0 minutes 98        If plan is discharge home, recommend the following: A little help with walking and/or transfers;A little help with bathing/dressing/bathroom;Assistance with cooking/housework;Help with stairs or ramp for entrance   Can travel by private vehicle        Equipment Recommendations Rolling walker (2 wheels) (may progress to no needs)  Recommendations for Other Services       Functional Status Assessment Patient has had a recent decline in their functional status and demonstrates the ability to make significant improvements in function in a reasonable and predictable amount of time.     Precautions / Restrictions Precautions Precautions: Fall      Mobility  Bed Mobility Overal bed  mobility: Needs Assistance Bed Mobility: Supine to Sit     Supine to sit: Contact guard     General bed mobility comments: CGA due to vertigo    Transfers Overall transfer level: Needs assistance Equipment used: 1 person hand held assist Transfers: Sit to/from Stand Sit to Stand: Contact guard assist           General transfer comment: CGA due to vertigo, mild unsteadiness    Ambulation/Gait Ambulation/Gait assistance: Contact guard assist Gait Distance (Feet): 80 Feet Assistive device: Rolling walker (2 wheels), 1 person hand held assist Gait Pattern/deviations: Step-through pattern, Decreased stride length Gait velocity: decreased     General Gait Details: mild unsteadiness, CGA safety, cues for focus point  Stairs            Wheelchair Mobility     Tilt Bed    Modified Rankin (Stroke Patients Only)       Balance Overall balance assessment: Needs assistance Sitting-balance support: No upper extremity supported Sitting balance-Leahy Scale: Good Sitting balance - Comments: Upon initial sit had vertigo requiring CGA but otherwise stable   Standing balance support: Single extremity supported, Reliant on assistive device for balance Standing balance-Leahy Scale: Poor Standing balance comment: needing UE support today due to mild unsteadiness , reports better than this morning                             Pertinent Vitals/Pain Pain Assessment Pain Assessment: No/denies pain    Home Living Family/patient  expects to be discharged to:: Private residence Living Arrangements: Alone Available Help at Discharge:  (very limited) Type of Home: House Home Access: Stairs to enter Entrance Stairs-Rails: Doctor, general practice of Steps: 1   Home Layout: One level Home Equipment: None      Prior Function Prior Level of Function : Independent/Modified Independent             Mobility Comments: enjoys going to work out classes  (did zumba day prior to fall) ADLs Comments: independent adls and iadls     Extremity/Trunk Assessment   Upper Extremity Assessment Upper Extremity Assessment: Overall WFL for tasks assessed    Lower Extremity Assessment Lower Extremity Assessment: Overall WFL for tasks assessed    Cervical / Trunk Assessment Cervical / Trunk Assessment: Normal  Communication        Cognition Arousal: Alert Behavior During Therapy: WFL for tasks assessed/performed   PT - Cognitive impairments: No apparent impairments                                 Cueing       General Comments General comments (skin integrity, edema, etc.): laceration posterior head from fall  Vestibular testing:  Hx: Pt denies vertigo (like current vertigo) in past.  Reports this started after she fell and hit posterior head yesterday.  Describes as spinning sensation with movement (transition to sitting, rolling, and standing) and then constant uneasiness.   Ocularmotor exam: Negative HINTS: negative  (MRI was also negative)  L Shona Grebe Dix: negative R Hall Pike Dix: postive upward rotation nystagmus, did have delayed response taking ~20 seconds for onset but then resolving < 20 sec.  Treated with Epley maneuver.   Provided education handouts on BPPV and self Epley HEP.   Exercises     Assessment/Plan    PT Assessment Patient needs continued PT services  PT Problem List Decreased mobility;Decreased activity tolerance;Decreased balance;Decreased knowledge of use of DME       PT Treatment Interventions DME instruction;Therapeutic exercise;Gait training;Balance training;Stair training;Functional mobility training;Therapeutic activities;Patient/family education;Canalith reposition;Neuromuscular re-education    PT Goals (Current goals can be found in the Care Plan section)  Acute Rehab PT Goals Patient Stated Goal: return home PT Goal Formulation: With patient Time For Goal Achievement:  01/11/24 Potential to Achieve Goals: Good    Frequency Min 3X/week     Co-evaluation               AM-PAC PT 6 Clicks Mobility  Outcome Measure Help needed turning from your back to your side while in a flat bed without using bedrails?: A Little Help needed moving from lying on your back to sitting on the side of a flat bed without using bedrails?: A Little Help needed moving to and from a bed to a chair (including a wheelchair)?: A Little Help needed standing up from a chair using your arms (e.g., wheelchair or bedside chair)?: A Little Help needed to walk in hospital room?: A Little Help needed climbing 3-5 steps with a railing? : A Little 6 Click Score: 18    End of Session Equipment Utilized During Treatment: Gait belt Activity Tolerance: Patient tolerated treatment well Patient left: with chair alarm set;in chair;with call bell/phone within reach Nurse Communication: Mobility status PT Visit Diagnosis: Dizziness and giddiness (R42);Other abnormalities of gait and mobility (R26.89)    Time: 1535-1606 PT Time Calculation (min) (ACUTE ONLY): 31 min  Charges:   PT Evaluation $PT Eval Moderate Complexity: 1 Mod PT Treatments $Canalith Rep Proc: 8-22 mins PT General Charges $$ ACUTE PT VISIT: 1 Visit         Benjiman, PT Acute Rehab [facility] Rehab [phone]   Benjiman VEAR Mulberry 12/28/2023, 5:46 PM

## 2023-12-29 DIAGNOSIS — S0990XS Unspecified injury of head, sequela: Secondary | ICD-10-CM | POA: Diagnosis not present

## 2023-12-29 DIAGNOSIS — R42 Dizziness and giddiness: Secondary | ICD-10-CM | POA: Diagnosis not present

## 2023-12-29 LAB — BASIC METABOLIC PANEL WITH GFR
Anion gap: 9 (ref 5–15)
BUN: 23 mg/dL (ref 8–23)
CO2: 26 mmol/L (ref 22–32)
Calcium: 9 mg/dL (ref 8.9–10.3)
Chloride: 99 mmol/L (ref 98–111)
Creatinine, Ser: 0.83 mg/dL (ref 0.44–1.00)
GFR, Estimated: >60 mL/min (ref >60)
Glucose, Bld: 92 mg/dL (ref 70–99)
Potassium: 4.2 mmol/L (ref 3.5–5.1)
Sodium: 134 mmol/L — ABNORMAL LOW (ref 135–145)

## 2023-12-29 LAB — CBC
HCT: 42.4 % (ref 36.0–46.0)
Hemoglobin: 13.6 g/dL (ref 12.0–15.0)
MCH: 30.4 pg (ref 26.0–34.0)
MCHC: 32.1 g/dL (ref 30.0–36.0)
MCV: 94.9 fL (ref 80.0–100.0)
Platelets: 218 K/uL (ref 150–400)
RBC: 4.47 MIL/uL (ref 3.87–5.11)
RDW: 14 % (ref 11.5–15.5)
WBC: 6.6 K/uL (ref 4.0–10.5)
nRBC: 0 % (ref 0.0–0.2)

## 2023-12-29 LAB — MAGNESIUM: Magnesium: 2.1 mg/dL (ref 1.7–2.4)

## 2023-12-29 MED ORDER — MECLIZINE HCL 25 MG PO TABS: 25.0000 mg | ORAL_TABLET | Freq: Three times a day (TID) | ORAL | 0 refills | Status: AC | PRN

## 2023-12-29 NOTE — Progress Notes (Signed)
 PROGRESS NOTE Terri Mcgee  FMW:[id number] DOB: [birth year] DOA: 12/27/2023 PCP: Gil Greig BRAVO, NP  Brief Narrative/Hospital Course:  Terri Mcgee is a 78 y.o. female with past medical history significant for hypothyroidism, hypertension and hypercholesterolemia presented to the hospital with syncope after feeling dizzy when she was about to go to the bathroom.  CT head trauma with bleeding and was unable to get up from the floor by herself.  Of note patient had history of right wrist fracture.  Patient was subsequently brought into the ED and was noted to have a laceration of the back of her head which was stapled.  In the ED patient had slightly elevated blood pressure.  CBC showed WBC at 10.8.  CMP within normal limits.  Complete trauma workup was done including CT scan of the head neck MRI of the brain without findings of fracture or obvious cranial trauma but she persisted to have dizziness and was considered for admission to the hospital for further evaluation and treatment. MRI unremarkable, orthostatic vitals negative.  Seen by PT OT vertigo has resolved mildly dizzy, advising outpatient vestibular rehab  Subjective: Seen and examined today Resting comfortably in bed with the PT She would like to see if she feels okay going home later today as lives alone and bit  dizzy Overnight on room air afebrile, VSS, Labs reviewed from yesterday normal  Assessment and plan:  Vertigo/recent head trauma Fall: Complains of vertigo and dizziness both.  MRI brain without acute finding, BP stable - orthostasis vitals negative this morning History of recent tinnitus and was told that if she had some middle ear effusion. Continue PT OT will need outpatient vestibular rehab.  Continue meclizine.   Overall improving clinically.  Syncope  Had some preceding dizziness.  Checked orthostatic vitals negative, TTE-no acute finding, carotid duplex 1-39% stenosis Could be due to volume depletion.  Received IV  fluids.  Cardiac workup negative troponin enzymes negative   Essential hypertension: BP remains stable holding diuretics   Hypothyroidism: Continue Synthroid .  TSH borderline 6.5.   Mobility: PT Orders: Active PT Follow up Rec: Outpatient Pt (Vestibular)12/28/2023 1744   DVT prophylaxis: SCDs Start: 12/27/23 1938 Code Status:   Code Status: Full Code Family Communication: plan of care discussed with patient at bedside. Patient status is: Remains hospitalized because of severity of illness Level of care: Telemetry   Dispo: The patient is from: home            Anticipated disposition: HOME TODAY OR TOMORROW  Objective: Vitals last 24 hrs: Vitals:   12/28/23 1450 12/28/23 1452 12/28/23 1843 12/29/23 0438  BP:  (!) 141/70 127/76 113/63  Pulse:  70 66 60  Resp:  18 18 18   Temp:  98.6 F (37 C) 98.4 F (36.9 C) 98.5 F (36.9 C)  TempSrc:  Oral Oral Oral  SpO2:  100% 97% 97%  Weight: 55.9 kg     Height: 5' 1 (1.549 m)       Physical Examination: General exam: alert awake, oriented, older than stated age HEENT:Oral mucosa moist, Ear/Nose WNL grossly Respiratory system: Bilaterally clear BS,no use of accessory muscle Cardiovascular system: S1 & S2 +, No JVD. Gastrointestinal system: Abdomen soft,NT,ND, BS+ Nervous System: Alert, awake, moving all extremities,and following commands. Extremities: extremities warm, leg edema ENG Skin: Warm, no rashes MSK: Normal muscle bulk,tone, power   Medications reviewed:  Scheduled Meds:  levothyroxine   88 mcg Oral QAC breakfast   pantoprazole   40 mg Oral Daily  Continuous Infusions: Diet: Diet Order             Diet regular Room service appropriate? Yes; Fluid consistency: Thin  Diet effective now                    Data Reviewed: I have personally reviewed following labs and imaging studies ( see epic result tab) CBC: Recent Labs  Lab 12/27/23 0925 12/28/23 0500 12/29/23 0651  WBC 10.8* 8.0 6.6  NEUTROABS 8.8*   --   --   HGB 12.2 13.1 13.6  HCT 38.3 41.8 42.4  MCV 96.5 95.9 94.9  PLT 180 170 218   CMP: Recent Labs  Lab 12/27/23 0925 12/28/23 0500 12/29/23 0651  NA 136 137 134*  K 4.0 3.9 4.2  CL 102 102 99  CO2 23 23 26   GLUCOSE 108* 90 92  BUN 22 19 23   CREATININE 0.72 0.65 0.83  CALCIUM  9.3 9.1 9.0  MG  --  2.0 2.1   GFR: Estimated Creatinine Clearance: 42.8 mL/min (by C-G formula based on SCr of 0.83 mg/dL). No results for input(s): AST, ALT, ALKPHOS, BILITOT, PROT, ALBUMIN in the last 168 hours. No results for input(s): LIPASE, AMYLASE in the last 168 hours. No results for input(s): AMMONIA in the last 168 hours. Coagulation Profile: No results for input(s): INR, PROTIME in the last 168 hours. Unresulted Labs (From admission, onward)    None      Antimicrobials/Microbiology: Anti-infectives (From admission, onward)    None      No results found for: SDES, SPECREQUEST, CULT, REPTSTATUS  Procedures:    Mennie LAMY, MD [facility] 12/29/2023, 11:36 AM

## 2023-12-29 NOTE — Plan of Care (Signed)

## 2023-12-29 NOTE — Plan of Care (Signed)

## 2023-12-29 NOTE — Progress Notes (Signed)
 Physical Therapy Treatment Patient Details Name: Terri Mcgee MRN: [id number] DOB: Aug 14, 1945 Today's Date: 12/29/2023   History of Present Illness Pt is 78 yo female admitted on 12/27/23 with vertigo after recent head trauma from fall hitting posterior head with laceration.  MRI was normal.  Pt with hx including but not limited to hypothyroidism, HTN, and HCL    PT Comments  Patient able to ambulate with RW in hallway no issues and denies symptoms with repeat BPPV testing.  Does have some light headedness initial standing so extensive education on importance of monitoring intake of clear liquids especially since she likes to attend exercise classes.  She voiced understanding and issued handout for information on BPPV.  Patient stable for home if medically ready with RW and outpatient PT referral.    If plan is discharge home, recommend the following: A little help with walking and/or transfers;A little help with bathing/dressing/bathroom;Assistance with cooking/housework;Help with stairs or ramp for entrance   Can travel by private vehicle        Equipment Recommendations  Rolling walker (2 wheels)    Recommendations for Other Services       Precautions / Restrictions Precautions Precautions: Fall Recall of Precautions/Restrictions: Intact Restrictions Weight Bearing Restrictions Per Provider Order: No     Mobility  Bed Mobility Overal bed mobility: Needs Assistance       Supine to sit: Contact guard     General bed mobility comments: for positioning for testing BPPV    Transfers   Equipment used: Rolling walker (2 wheels) Transfers: Sit to/from Stand Sit to Stand: Modified independent (Device/Increase time)                Ambulation/Gait Ambulation/Gait assistance: Supervision Gait Distance (Feet): 300 Feet Assistive device: Rolling walker (2 wheels) Gait Pattern/deviations: WFL(Within Functional Limits)       General Gait Details: no LOB, did not  want to try without walker, then stated I don't think I would use one at home   Stairs             Wheelchair Mobility     Tilt Bed    Modified Rankin (Stroke Patients Only)       Balance     Sitting balance-Leahy Scale: Normal       Standing balance-Leahy Scale: Good Standing balance comment: moving in room without walker, though preferred walker in hallway                            Communication Communication Communication: No apparent difficulties  Cognition Arousal: Alert Behavior During Therapy: WFL for tasks assessed/performed   PT - Cognitive impairments: No apparent impairments                         Following commands: Intact      Cueing Cueing Techniques: Verbal cues  Exercises      General Comments General comments (skin integrity, edema, etc.): Testing for BPPV bilateral dix hallpike no symptoms and no nystagmus seen; already in chair so orthostatics not taken, reported mild light headedness first standing from chair.  Educated on dehydration and importance of drinking clear fluids throughout the day and monitoring intake to avoid dehydration symptoms.  Also importance of getting up slowly. Issued handout on BPPV for education.      Pertinent Vitals/Pain Pain Assessment Pain Assessment: Faces Faces Pain Scale: Hurts little more Pain Location: back of head where  staples are with pressure Pain Descriptors / Indicators: Tender Pain Intervention(s): Monitored during session, Limited activity within patient's tolerance    Home Living                          Prior Function            PT Goals (current goals can now be found in the care plan section) Progress towards PT goals: Progressing toward goals    Frequency    Min 3X/week      PT Plan      Co-evaluation              AM-PAC PT 6 Clicks Mobility   Outcome Measure  Help needed turning from your back to your side while in a flat  bed without using bedrails?: None Help needed moving from lying on your back to sitting on the side of a flat bed without using bedrails?: None Help needed moving to and from a bed to a chair (including a wheelchair)?: None Help needed standing up from a chair using your arms (e.g., wheelchair or bedside chair)?: None Help needed to walk in hospital room?: None Help needed climbing 3-5 steps with a railing? : Total 6 Click Score: 21    End of Session   Activity Tolerance: Patient tolerated treatment well Patient left: in chair;with call bell/phone within reach   PT Visit Diagnosis: Dizziness and giddiness (R42);Other abnormalities of gait and mobility (R26.89)     Time: 9051-8984 PT Time Calculation (min) (ACUTE ONLY): 27 min  Charges:    $Gait Training: 8-22 mins $Self Care/Home Management: 8-22 PT General Charges $$ ACUTE PT VISIT: 1 Visit                     Micheline Portal, PT Acute Rehabilitation Services Office:[phone] 12/29/2023    [name] 12/29/2023, 10:26 AM

## 2023-12-30 DIAGNOSIS — S0990XS Unspecified injury of head, sequela: Secondary | ICD-10-CM | POA: Diagnosis not present

## 2023-12-30 DIAGNOSIS — R42 Dizziness and giddiness: Secondary | ICD-10-CM | POA: Diagnosis not present

## 2023-12-30 NOTE — TOC Initial Note (Signed)
 Transition of Care [facility]) - Initial/Assessment Note    Patient Details  Name: Terri Mcgee MRN: [id number] Date of Birth: 04-05-1945  [facility]) CM/SW Contact:    Sonda Manuella Quill, RN Phone Number: 12/30/2023, 11:23 AM  Clinical Narrative:                 Orders received for RW; spoke w/ pt in room; pt said she lives at home; she plans to return at d/c; pt identified POC Makenlee Mckeag (relative) [phone]; she will provide transportation; pt verified insurance/PCP; she denied SDOH risks; pt does not have DME, HH services, or home oxygen ; she declined receiving RW; pt will follow up w/ PCP for OP neuro specialty clinic; no IP CM needs.  Expected Discharge Plan: Home/Self Care Barriers to Discharge: No Barriers Identified   Patient Goals and CMS Choice Patient states their goals for this hospitalization and ongoing recovery are:: home CMS Medicare.gov Compare Post Acute Care list provided to:: Patient   [place] ownership interest in [facility].provided to:: Patient    Expected Discharge Plan and Services   Discharge Planning Services: CM Consult Post Acute Care Choice: NA (pt declined RW) Living arrangements for the past 2 months: Single Family Home Expected Discharge Date: 12/30/23               DME Arranged: N/A (pt declined RW) DME Agency: NA       HH Arranged: NA HH Agency: NA        Prior Living Arrangements/Services Living arrangements for the past 2 months: Single Family Home Lives with:: Self Patient language and need for interpreter reviewed:: Yes Do you feel safe going back to the place where you live?: Yes      Need for Family Participation in Patient Care: Yes (Comment) Care giver support system in place?: Yes (comment) Current home services:  (n/a) Criminal Activity/Legal Involvement Pertinent to Current Situation/Hospitalization: No - Comment as needed  Activities of Daily Living   ADL Screening (condition at  time of admission) Independently performs ADLs?: Yes (appropriate for developmental age) Is the patient deaf or have difficulty hearing?: No Does the patient have difficulty seeing, even when wearing glasses/contacts?: No Does the patient have difficulty concentrating, remembering, or making decisions?: No  Permission Sought/Granted Permission sought to share information with : Case Manager Permission granted to share information with : Yes, Verbal Permission Granted  Share Information with NAME: Case Manager     Permission granted to share info w Relationship: Layal Javid (relative) [phone]     Emotional Assessment Appearance:: Appears stated age Attitude/Demeanor/Rapport: Gracious Affect (typically observed): Accepting Orientation: : Oriented to Self, Oriented to Place, Oriented to  Time, Oriented to Situation Alcohol / Substance Use: Not Applicable Psych Involvement: No (comment)  Admission diagnosis:  Dizziness [R42] Vertigo [R42] Injury of head, initial encounter [S09.90XA] Fall, initial encounter [W19.XXXA] Laceration of scalp, initial encounter [S01.01XA] 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 [place] [address] Phone: [phone] Fax: [phone]     Social Drivers of Health (SDOH) Social History: SDOH Screenings   Food Insecurity: No Food Insecurity  (12/30/2023)  Housing: Low Risk  (12/30/2023)  Transportation Needs: No Transportation Needs (12/30/2023)  Utilities: Not At Risk (12/30/2023)  Alcohol Screen: Low Risk  (12/14/2022)  Depression (PHQ2-9): Low Risk  (07/02/2023)  Financial Resource Strain: Low Risk  (12/14/2022)  Physical Activity: Sufficiently Active (12/14/2022)  Social Connections: Moderately Isolated (12/28/2023)  Stress: Stress Concern Present (12/14/2022)  Tobacco Use: Low Risk  (12/27/2023)  Health Literacy: Adequate Health Literacy (12/14/2022)   SDOH Interventions: Food Insecurity Interventions: Intervention Not Indicated, Inpatient TOC Housing Interventions: Intervention Not Indicated, Inpatient TOC Transportation Interventions: Intervention Not Indicated, Inpatient TOC Utilities Interventions: Intervention Not Indicated, Inpatient TOC   Readmission Risk Interventions     No data to display

## 2023-12-30 NOTE — Discharge Summary (Signed)
 Physician Discharge Summary  Terri Mcgee FMW:[id number] DOB: 12/09/1945 DOA: 12/27/2023  PCP: Gil Greig BRAVO, NP  Admit date: 12/27/2023 Discharge date: 12/30/2023 Recommendations for Outpatient Follow-up:  Follow up with PCP in 1 weeks-for staple removal and call for appointment Please obtain BMP/CBC in one week  Discharge Dispo: home Discharge Condition: Stable Code Status:   Code Status: Full Code Diet recommendation:  Diet Order             Diet - low sodium heart healthy           Diet regular Room service appropriate? Yes; Fluid consistency: Thin  Diet effective now                    Brief/Interim Summary:  Terri Mcgee is a 78 y.o. female with past medical history significant for hypothyroidism, hypertension and hypercholesterolemia presented to the hospital with syncope after feeling dizzy when she was about to go to the bathroom.  CT head trauma with bleeding and was unable to get up from the floor by herself.  Of note patient had history of right wrist fracture.  Patient was subsequently brought into the ED and was noted to have a laceration of the back of her head which was stapled.  In the ED patient had slightly elevated blood pressure.  CBC showed WBC at 10.8.  CMP within normal limits.  Complete trauma workup was done including CT scan of the head neck MRI of the brain without findings of fracture or obvious cranial trauma but she persisted to have dizziness and was considered for admission to the hospital for further evaluation and treatment. MRI unremarkable, orthostatic vitals negative.  Seen by PT OT vertigo has resolved mildly dizzy, advising outpatient vestibular rehab She has remained stable for discharge home today  Subjective: Seen and examined No acute events overnight, she feels okay to go home today She is in the bedside chair.  She feels well  Discharge Diagnoses:   Vertigo/recent head trauma Fall: Complains of vertigo and dizziness both.   MRI brain without acute finding, BP stable - orthostasis vitals negative History of recent tinnitus and was told that if she had some middle ear effusion. Continue PT OT- outpatient vestibular rehab prn meclizine.   Stable for discharge.She had Foley stable placed on 10/16 advised follow-up with PCP to re-evaluate in 7-10 days and patient aware..  Syncope:  Had some preceding dizziness.  Checked orthostatic vitals negative, TTE-no acute finding, carotid duplex 1-39% stenosis Could be due to volume depletion.  Received IV fluids.  Cardiac workup negative troponin enzymes negative   Essential hypertension: BP remains stable hoeld meds here-check blood pressure at home and resume slowly and discuss with PCP    Hypothyroidism: Continue Synthroid .  TSH borderline 6.5.   Mobility: PT Orders: Active PT Follow up Rec: Outpatient Pt ([facility])12/29/2023 1025   DVT prophylaxis: SCDs Start: 12/27/23 1938 Code Status:   Code Status: Full Code Family Communication: plan of care discussed with patient at bedside. Patient status is: Remains hospitalized because of severity of illness Level of care: Telemetry   Dispo: The patient is from: home            Anticipated disposition: home today  Objective: Vitals last 24 hrs: Vitals:   12/29/23 0817 12/29/23 1313 12/29/23 2039 12/30/23 0500  BP: (!) 166/77 134/71 125/85 (!) 115/56  Pulse: (!) 52 67 69 (!) 57  Resp:  16 16   Temp:  98.7 F (37.1 C) 98.4 F (36.9 C) 98.3 F (36.8 C)  TempSrc:  Oral Oral Oral  SpO2: 99% 98% 97% 98%  Weight:      Height:        Physical Examination: General exam: alert awake, oriented, older than stated age HEENT:Oral mucosa moist, Ear/Nose WNL grossly Respiratory system: Bilaterally clear BS,no use of accessory muscle Cardiovascular system: S1 & S2 +, No JVD. Gastrointestinal system: Abdomen soft,NT,ND, BS+ Nervous System: Alert, awake, moving all extremities,and following  commands. Extremities: extremities warm, leg edema ENG Skin: Warm, no rashes MSK: Normal muscle bulk,tone, power      Consultation: See note.  Discharge Instructions  Discharge Instructions     Diet - low sodium heart healthy   Complete by: As directed    Discharge instructions   Complete by: As directed    Please call call MD or return to ER for similar or worsening recurring problem that brought you to hospital or if any fever,nausea/vomiting,abdominal pain, uncontrolled pain, chest pain,  shortness of breath or any other alarming symptoms.  Please follow-up your doctor as instructed in a week time and call the office for appointment.  Please avoid alcohol, smoking, or any other illicit substance and maintain healthy habits including taking your regular medications as prescribed.  You were cared for by a hospitalist during your hospital stay. If you have any questions about your discharge medications or the care you received while you were in the hospital after you are discharged, you can call the unit and ask to speak with the hospitalist on call if the hospitalist that took care of you is not available.  Once you are discharged, your primary care physician will handle any further medical issues. Please note that NO REFILLS for any discharge medications will be authorized once you are discharged, as it is imperative that you return to your primary care physician (or establish a relationship with a primary care physician if you do not have one) for your aftercare needs so that they can reassess your need for medications and monitor your lab values   Discharge wound care:   Complete by: As directed    Follow-up with PCP in 7 days to evaluate wound/LACERATAION REPAir AND Further care and for staple removal from 7 to 10 days of placement   Increase activity slowly   Complete by: As directed       Allergies as of 12/30/2023       Reactions   Penicillins Shortness Of Breath, Rash    As a child   Terbinafine  And Related    Headache, blurred vision, dizziness   Tetanus Toxoid Rash   Tetanus Toxoid Adsorbed Rash        Medication List     TAKE these medications    acetaminophen  500 MG tablet Commonly known as: TYLENOL  Take 1,000 mg by mouth as needed for mild pain (pain score 1-3) or moderate pain (pain score 4-6).   aspirin  81 MG chewable tablet Chew 81 mg by mouth at bedtime.   CALCIUM  1200 PO Take 1 tablet by mouth daily with lunch. Chew tablet   gabapentin  100 MG capsule Commonly known as: NEURONTIN  Take 2 capsules (200 mg total) by mouth at bedtime.   levothyroxine  88 MCG tablet Commonly known as: SYNTHROID  Take 1 tablet by mouth once daily What changed: when to take this   lisinopril -hydrochlorothiazide  10-12.5 MG tablet Commonly known as: ZESTORETIC  Take 1 tablet by mouth once daily What changed: when to  take this   loperamide 2 MG tablet Commonly known as: IMODIUM A-D Take 2 mg by mouth as needed for diarrhea or loose stools.   meclizine 25 MG tablet Commonly known as: ANTIVERT Take 1 tablet (25 mg total) by mouth 3 (three) times daily as needed for dizziness.   MULTIVITAMIN GUMMIES ADULT PO Take 1 tablet by mouth daily with lunch. Chew tablet   simvastatin  20 MG tablet Commonly known as: ZOCOR  TAKE 1 TABLET BY MOUTH ONCE DAILY AT  6PM What changed: See the new instructions.   Turmeric 500 MG Tabs Take 500 mg by mouth at bedtime. Chew tablet               Discharge Care Instructions  (From admission, onward)           Start     Ordered   12/30/23 0000  Discharge wound care:       Comments: Follow-up with PCP in 7 days to evaluate wound/LACERATAION REPAir AND Further care and for staple removal from 7 to 10 days of placement   12/30/23 1044            Follow-up Information     Chiaramonti, Marsa HERO, MD. Schedule an appointment as soon as possible for a visit .   Specialties: Orthopedic Surgery, Hand  Surgery Contact information: [address] [place] [address] [phone]         Gil No E, NP Follow up in 1 week(s).   Specialty: Adult Health Nurse Practitioner Contact information: [address] [place] [address] [phone]                Allergies  Allergen Reactions   Penicillins Shortness Of Breath and Rash    As a child   Terbinafine  And Related     Headache, blurred vision, dizziness   Tetanus Toxoid Rash   Tetanus Toxoid Adsorbed Rash    The results of significant diagnostics from this hospitalization (including imaging, microbiology, ancillary and laboratory) are listed below for reference.    Microbiology: No results found for this or any previous visit (from the past 240 hours).  Procedures/Studies: ECHOCARDIOGRAM COMPLETE Result Date: 12/28/2023    ECHOCARDIOGRAM REPORT   Patient Name:   Terri Mcgee Date of Exam: 12/28/2023 Medical Rec #:  [id number]        Height:       61.0 in Accession #:    [id number]       Weight:       126.6 lb Date of Birth:  11-10-1945         BSA:          1.555 m Patient Age:    78 years         BP:           118/95 mmHg Patient Gender: F                HR:           76 bpm. Exam Location:  Inpatient Procedure: 2D Echo, Cardiac Doppler and Color Doppler (Both Spectral and Color            Flow Doppler were utilized during procedure). Indications:    Syncope R55  History:        Patient has no prior history of Echocardiogram examinations.                 Risk Factors:Hypertension.  Sonographer:  Jayson Gaskins Referring Phys: [phone] CLAUDIA CLAIBORNE IMPRESSIONS  1. Left ventricular ejection fraction, by estimation, is 70 to 75%. The left ventricle has hyperdynamic function. The left ventricle has no regional wall motion abnormalities. Left ventricular diastolic parameters are indeterminate.  2. Right ventricular systolic function is normal. The right ventricular size is normal. There is mildly elevated pulmonary  artery systolic pressure.  3. Left atrial size was mildly dilated.  4. The mitral valve is myxomatous. Trivial mitral valve regurgitation.  5. The aortic valve is calcified. Aortic valve regurgitation is not visualized. Velocities trough the aortic valve increased due to hyperdynamic state.  6. There is Moderate (Grade III) plaque involving the ascending aorta.  7. The inferior vena cava is normal in size with <50% respiratory variability, suggesting right atrial pressure of 8 mmHg. Comparison(s): No prior Echocardiogram. FINDINGS  Left Ventricle: Left ventricular ejection fraction, by estimation, is 70 to 75%. The left ventricle has hyperdynamic function. The left ventricle has no regional wall motion abnormalities. The left ventricular internal cavity size was normal in size. There is no left ventricular hypertrophy. Left ventricular diastolic parameters are indeterminate. Right Ventricle: The right ventricular size is normal. No increase in right ventricular wall thickness. Right ventricular systolic function is normal. There is mildly elevated pulmonary artery systolic pressure. The tricuspid regurgitant velocity is 2.82  m/s, and with an assumed right atrial pressure of 8 mmHg, the estimated right ventricular systolic pressure is 39.8 mmHg. Left Atrium: Left atrial size was mildly dilated. Right Atrium: Right atrial size was normal in size. Pericardium: There is no evidence of pericardial effusion. Mitral Valve: The mitral valve is myxomatous. There is moderate thickening of the mitral valve leaflet(s). Trivial mitral valve regurgitation. Tricuspid Valve: The tricuspid valve is grossly normal. Tricuspid valve regurgitation is mild. Aortic Valve: The aortic valve is calcified. Aortic valve regurgitation is not visualized. Velocities trough the aortic valve increased due to hyperdynamic state. Aortic valve mean gradient measures 12.0 mmHg. Aortic valve peak gradient measures 23.8 mmHg. Aortic valve area, by VTI  measures 1.42 cm. Pulmonic Valve: The pulmonic valve was grossly normal. Pulmonic valve regurgitation is not visualized. Aorta: The aortic root is normal in size and structure. There is moderate (Grade III) plaque involving the ascending aorta. Venous: The inferior vena cava is normal in size with less than 50% respiratory variability, suggesting right atrial pressure of 8 mmHg. IAS/Shunts: No atrial level shunt detected by color flow Doppler.  LEFT VENTRICLE PLAX 2D LVIDd:         2.80 cm   Diastology LVIDs:         1.90 cm   LV e' medial:    8.49 cm/s LV PW:         0.90 cm   LV E/e' medial:  12.0 LV IVS:        1.10 cm   LV e' lateral:   8.16 cm/s LVOT diam:     1.70 cm   LV E/e' lateral: 12.5 LV SV:         57 LV SV Index:   37 LVOT Area:     2.27 cm  RIGHT VENTRICLE RV S prime:     12.20 cm/s TAPSE (M-mode): 2.5 cm LEFT ATRIUM             Index        RIGHT ATRIUM          Index LA Vol (A2C):   27.4 ml 17.62 ml/m  RA Area:  9.47 cm LA Vol (A4C):   57.5 ml 36.97 ml/m  RA Volume:   18.90 ml 12.15 ml/m LA Biplane Vol: 41.3 ml 26.56 ml/m  AORTIC VALVE AV Area (Vmax):    1.38 cm AV Area (Vmean):   1.52 cm AV Area (VTI):     1.42 cm AV Vmax:           244.00 cm/s AV Vmean:          134.000 cm/s AV VTI:            0.402 m AV Peak Grad:      23.8 mmHg AV Mean Grad:      12.0 mmHg LVOT Vmax:         148.00 cm/s LVOT Vmean:        89.500 cm/s LVOT VTI:          0.252 m LVOT/AV VTI ratio: 0.63  AORTA Ao Root diam: 2.40 cm MITRAL VALVE                TRICUSPID VALVE MV Area (PHT): 3.34 cm     TR Peak grad:   31.8 mmHg MV Decel Time: 227 msec     TR Vmax:        282.00 cm/s MV E velocity: 102.00 cm/s MV A velocity: 102.00 cm/s  SHUNTS MV E/A ratio:  1.00         Systemic VTI:  0.25 m                             Systemic Diam: 1.70 cm Emeline Calender Electronically signed by Emeline Calender Signature Date/Time: 12/28/2023/4:59:03 PM    Final    VAS US  CAROTID Result Date: 12/28/2023 Carotid Arterial Duplex Study  Patient Name:  Terri Mcgee  Date of Exam:   12/28/2023 Medical Rec #: [id number]         Accession #:    [id number] Date of Birth: February 01, 1946          Patient Gender: F Patient Age:   21 years Exam Location:  [facility] Procedure:      VAS US  CAROTID Referring Phys: WILL BERNARD --------------------------------------------------------------------------------  Indications:       Syncope, Weakness and dizziness. Risk Factors:      Hypertension, hyperlipidemia. Comparison Study:  No priors. Performing Technologist: Ricka Sturdivant-Jones RDMS, RVT  Examination Guidelines: A complete evaluation includes B-mode imaging, spectral Doppler, color Doppler, and power Doppler as needed of all accessible portions of each vessel. Bilateral testing is considered an integral part of a complete examination. Limited examinations for reoccurring indications may be performed as noted.  Right Carotid Findings: +----------+--------+--------+--------+------------------+------------------+           PSV cm/sEDV cm/sStenosisPlaque DescriptionComments           +----------+--------+--------+--------+------------------+------------------+ CCA Prox  147     17                                tortuous           +----------+--------+--------+--------+------------------+------------------+ CCA Distal119     23                                intimal thickening +----------+--------+--------+--------+------------------+------------------+ ICA Prox  71      21      1-39%  intimal thickening +----------+--------+--------+--------+------------------+------------------+ ICA Mid   83      17                                                   +----------+--------+--------+--------+------------------+------------------+ ICA Distal70      19                                tortuous           +----------+--------+--------+--------+------------------+------------------+ ECA       119      10                                                   +----------+--------+--------+--------+------------------+------------------+ +----------+--------+-------+----------------+-------------------+           PSV cm/sEDV cmsDescribe        Arm Pressure (mmHG) +----------+--------+-------+----------------+-------------------+ Subclavian200            Multiphasic, WNL                    +----------+--------+-------+----------------+-------------------+ +---------+--------+--+--------+--+---------+ VertebralPSV cm/s71EDV cm/s18Antegrade +---------+--------+--+--------+--+---------+  Left Carotid Findings: +----------+--------+--------+--------+------------------+------------------+           PSV cm/sEDV cm/sStenosisPlaque DescriptionComments           +----------+--------+--------+--------+------------------+------------------+ CCA Prox  114     18                                                   +----------+--------+--------+--------+------------------+------------------+ CCA Distal71      14                                intimal thickening +----------+--------+--------+--------+------------------+------------------+ ICA Prox  110     21      1-39%   calcific                             +----------+--------+--------+--------+------------------+------------------+ ICA Distal102     23                                                   +----------+--------+--------+--------+------------------+------------------+ ECA       95      11                                                   +----------+--------+--------+--------+------------------+------------------+ +----------+--------+--------+----------------+-------------------+           PSV cm/sEDV cm/sDescribe        Arm Pressure (mmHG) +----------+--------+--------+----------------+-------------------+ [id number]             Multiphasic, WNL                     +----------+--------+--------+----------------+-------------------+ +---------+--------+--+--------+--+---------+  VertebralPSV cm/s76EDV cm/s22Antegrade +---------+--------+--+--------+--+---------+   Summary: Right Carotid: Velocities in the right ICA are consistent with a 1-39% stenosis. Left Carotid: Velocities in the left ICA are consistent with a 1-39% stenosis. Vertebrals:  Bilateral vertebral arteries demonstrate antegrade flow. Subclavians: Normal flow hemodynamics were seen in bilateral subclavian              arteries. *See table(s) above for measurements and observations.  Electronically signed by Lonni Gaskins MD on 12/28/2023 at 11:57:40 AM.    Final    MR BRAIN WO CONTRAST Result Date: 12/27/2023 EXAM: MRI BRAIN WITHOUT CONTRAST 12/27/2023 03:05:00 PM TECHNIQUE: Multiplanar multisequence MRI of the head/brain was performed without the administration of intravenous contrast. COMPARISON: Same day CT head. CLINICAL HISTORY: Headache, neuro deficit; new onset vertigo with horizontal nystagmus post syncopal episode. FINDINGS: BRAIN AND VENTRICLES: No acute infarct. No intracranial hemorrhage. No mass. No midline shift. No hydrocephalus. Scattered T2 FLAIR hyperintensity in the periventricular and subcortical white matter likely reflecting mild chronic microvascular ischemic changes. Mild parenchymal volume loss. Prominent perivascular spaces in the bilateral basal ganglia. 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 The sella is unremarkable. Normal flow voids. ORBITS: Bilateral lens replacement. SINUSES AND MASTOIDS: No acute abnormality. BONES AND SOFT TISSUES: Normal marrow signal. No acute soft tissue abnormality. Degenerative changes in the visualized upper cervical spine. There is trace spondylolisthesis of C3 on C4 likely related to degenerative changes. IMPRESSION: 1. No acute intracranial abnormality. 2. Mild chronic microvascular ischemic changes. 3. 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 4. Degenerative changes in the visualized upper cervical spine with trace C3 on C4 spondylolisthesis likely degenerative. Electronically signed by: Donnice Mania MD 12/27/2023 04:34 PM EDT RP Workstation: [id number]   CT Head Wo Contrast Result Date: 12/27/2023 CLINICAL DATA:  Syncopal episode with a fall.  Head and neck trauma. EXAM: CT HEAD WITHOUT CONTRAST CT CERVICAL SPINE WITHOUT CONTRAST TECHNIQUE: Multidetector CT imaging of the head and cervical spine was performed following the standard protocol without intravenous contrast. Multiplanar CT image reconstructions of the cervical spine were also generated. RADIATION DOSE REDUCTION: This exam was performed according to the departmental dose-optimization program which includes automated exposure control, adjustment of the mA and/or kV according to patient size and/or use of iterative reconstruction technique. COMPARISON:  None Available. FINDINGS: CT HEAD FINDINGS Brain: There is no evidence for acute hemorrhage, hydrocephalus, mass lesion, or abnormal extra-axial fluid collection. No definite CT evidence for acute infarction. Vascular: No hyperdense vessel or unexpected calcification. Skull: No evidence for fracture. No worrisome lytic or sclerotic lesion. Sinuses/Orbits: The visualized paranasal sinuses and mastoid air cells are clear. Visualized portions of the globes and intraorbital fat are unremarkable. Other: None. CT CERVICAL SPINE FINDINGS Alignment: Trace degenerative spondylolisthesis is seen at C3-4 and C4-5. Skull base and vertebrae: No acute fracture. No primary bone lesion or focal pathologic process. Soft tissues and spinal canal: No prevertebral fluid or swelling. No visible canal hematoma. Disc levels: Loss of disc height noted C3-4 and C5-6. Marked loss of disc height at C6-7 evident with associated endplate degeneration. Diffuse facet osteoarthropathy noted bilaterally. Upper chest: No acute findings. Other: None.  IMPRESSION: 1. No acute intracranial abnormality. 2. Degenerative changes in the cervical spine without fracture. Electronically Signed   By: Camellia Candle M.D.   On: 12/27/2023 10:28   CT Cervical Spine Wo Contrast Result Date: 12/27/2023 CLINICAL DATA:  Syncopal episode with a fall.  Head and neck trauma. EXAM: CT HEAD WITHOUT CONTRAST CT CERVICAL SPINE  WITHOUT CONTRAST TECHNIQUE: Multidetector CT imaging of the head and cervical spine was performed following the standard protocol without intravenous contrast. Multiplanar CT image reconstructions of the cervical spine were also generated. RADIATION DOSE REDUCTION: This exam was performed according to the departmental dose-optimization program which includes automated exposure control, adjustment of the mA and/or kV according to patient size and/or use of iterative reconstruction technique. COMPARISON:  None Available. FINDINGS: CT HEAD FINDINGS Brain: There is no evidence for acute hemorrhage, hydrocephalus, mass lesion, or abnormal extra-axial fluid collection. No definite CT evidence for acute infarction. Vascular: No hyperdense vessel or unexpected calcification. Skull: No evidence for fracture. No worrisome lytic or sclerotic lesion. Sinuses/Orbits: The visualized paranasal sinuses and mastoid air cells are clear. Visualized portions of the globes and intraorbital fat are unremarkable. Other: None. CT CERVICAL SPINE FINDINGS Alignment: Trace degenerative spondylolisthesis is seen at C3-4 and C4-5. Skull base and vertebrae: No acute fracture. No primary bone lesion or focal pathologic process. Soft tissues and spinal canal: No prevertebral fluid or swelling. No visible canal hematoma. Disc levels: Loss of disc height noted C3-4 and C5-6. Marked loss of disc height at C6-7 evident with associated endplate degeneration. Diffuse facet osteoarthropathy noted bilaterally. Upper chest: No acute findings. Other: None. IMPRESSION: 1. No acute intracranial  abnormality. 2. Degenerative changes in the cervical spine without fracture. Electronically Signed   By: Camellia Candle M.D.   On: 12/27/2023 10:28   DG Chest 2 View Result Date: 12/27/2023 EXAM: 2 VIEW(S) XRAY OF THE CHEST 12/27/2023 10:11:00 AM COMPARISON: 09/22/2021 CLINICAL HISTORY: syncope with L hand pain. Pt states syncope and is having pain in her first digit on her left hand which radiates down into her wrist. ; Per chart Pt BIBA from home for syncopal fall. Pt reports feeling dizzy when standing to go to restroom this am then woke up on the bathroom floor with laceration to posterior midline of head. ; Pt was unable to remove rings for imaging FINDINGS: LUNGS AND PLEURA: No focal pulmonary opacity. No pulmonary edema. No pleural effusion. No pneumothorax. HEART AND MEDIASTINUM: No acute abnormality of the cardiac silhouette. BONES AND SOFT TISSUES: No acute osseous abnormality. IMPRESSION: 1. No acute cardiopulmonary process. Electronically signed by: Lynwood Seip MD 12/27/2023 10:23 AM EDT RP Workstation: [id number]   DG Hand Complete Left Result Date: 12/27/2023 CLINICAL DATA:  Pain in her first digit of the left hand. EXAM: LEFT HAND - COMPLETE 3+ VIEW COMPARISON:  None Available. FINDINGS: No evidence for an acute fracture. No dislocation. Marked widening of the scapholunate distance is compatible dissociation. IMPRESSION: 1. No acute bony abnormality. 2. 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 Electronically Signed   By: Camellia Candle M.D.   On: 12/27/2023 10:23    Labs: BNP (last 3 results) No results for input(s): BNP in the last 8760 hours. Basic Metabolic Panel: Recent Labs  Lab 12/27/23 0925 12/28/23 0500 12/29/23 0651  NA 136 137 134*  K 4.0 3.9 4.2  CL 102 102 99  CO2 23 23 26   GLUCOSE 108* 90 92  BUN 22 19 23   CREATININE 0.72 0.65 0.83  CALCIUM  9.3 9.1 9.0  MG  --  2.0 2.1   Liver Function Tests: No results for  input(s): AST, ALT, ALKPHOS, BILITOT, PROT, ALBUMIN in the last 168 hours. No results for input(s): LIPASE, AMYLASE in the last 168 hours. No results for input(s): AMMONIA in the last 168 hours. CBC: Recent Labs  Lab 12/27/23 [phone]  12/28/23 0500 12/29/23 0651  WBC 10.8* 8.0 6.6  NEUTROABS 8.8*  --   --   HGB 12.2 13.1 13.6  HCT 38.3 41.8 42.4  MCV 96.5 95.9 94.9  PLT 180 170 218   CBG: Recent Labs  Lab 12/27/23 0926 12/27/23 1413  GLUCAP 92 143*  Thyroid  function studies Recent Labs    12/28/23 1855  TSH 6.580*   Urinalysis No results found for: COLORURINE, APPEARANCEUR, LABSPEC, PHURINE, GLUCOSEU, HGBUR, BILIRUBINUR, KETONESUR, PROTEINUR, UROBILINOGEN, NITRITE, LEUKOCYTESUR Sepsis Labs Recent Labs  Lab 12/27/23 0925 12/28/23 0500 12/29/23 0651  WBC 10.8* 8.0 6.6   Microbiology No results found for this or any previous visit (from the past 240 hours).   Time coordinating discharge: 25 minutes  SIGNED: Mennie LAMY, MD  [facility] 12/30/2023, 10:45 AM  If 7PM-7AM, please contact night-coverage [email or web address]

## 2024-01-10 ENCOUNTER — Encounter: Payer: Self-pay | Admitting: Orthopedic Surgery

## 2024-01-10 ENCOUNTER — Encounter: Payer: PPO | Admitting: Orthopedic Surgery

## 2024-01-10 ENCOUNTER — Ambulatory Visit: Payer: PPO | Admitting: Orthopedic Surgery

## 2024-01-10 VITALS — BP 114/74 | HR 72 | Temp 98.0°F | Ht 61.0 in | Wt 125.6 lb

## 2024-01-10 DIAGNOSIS — E7800 Pure hypercholesterolemia, unspecified: Secondary | ICD-10-CM

## 2024-01-10 DIAGNOSIS — I10 Essential (primary) hypertension: Secondary | ICD-10-CM

## 2024-01-10 DIAGNOSIS — S0990XD Unspecified injury of head, subsequent encounter: Secondary | ICD-10-CM

## 2024-01-10 DIAGNOSIS — M25571 Pain in right ankle and joints of right foot: Secondary | ICD-10-CM | POA: Diagnosis not present

## 2024-01-10 DIAGNOSIS — E039 Hypothyroidism, unspecified: Secondary | ICD-10-CM | POA: Diagnosis not present

## 2024-01-10 DIAGNOSIS — H8111 Benign paroxysmal vertigo, right ear: Secondary | ICD-10-CM | POA: Diagnosis not present

## 2024-01-10 NOTE — Patient Instructions (Addendum)
 Check blood pressure twice daily x 2 weeks, and drop off results > we can consider stopping blood pressure medication  Drink Drink Drink Water!!!!! At least 40-60 ounces daily   Dehydration is the number one cause of vertigo/ dizziness  Life Alert [phone]  Consider smart watch

## 2024-01-10 NOTE — Progress Notes (Signed)
 Careteam: Patient Care Team: Gil Greig BRAVO, NP as PCP - General (Adult Health Nurse Practitioner) Cleatus Collar, MD as Consulting Physician (Ophthalmology) Sheldon Standing, MD as Consulting Physician (General Surgery)  Seen by: Greig Gil, AGNP-C  PLACE OF SERVICE:  [facility] [place] [facility]  Advanced Directive information    Allergies  Allergen Reactions   Penicillins Shortness Of Breath and Rash    As a child   Terbinafine  And Related     Headache, blurred vision, dizziness   Tetanus Toxoid Rash   Tetanus Toxoid Adsorbed Rash    Chief Complaint  Patient presents with   Annual Exam    Patient would discuss calcium  medication. Would like to discuss her fear of falling. Patient would like stitches removed from back of head.     HPI: Patient is a 78 y.o. female seen today for medical management of chronic conditions.   Discussed the use of AI scribe software for clinical note transcription with the patient, who gave verbal consent to proceed.  History of Present Illness   Terri Mcgee is a 78 year old female who presents for follow-up after a head injury and subsequent vertigo.  She experienced a fall on October 16th, resulting in a head injury and hospitalization at [facility] for four days. 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 She was told by the hospital that her vertigo was likely due to BPPV following her head injury. No current vertigo symptoms are reported. She is concerned about future falls and is considering a medical alert necklace.  The fall occurred after getting up from the toilet, with no memory of the event until she found herself on the floor. She experienced significant dizziness, requiring her to crawl to reach her phone and unlock the door for emergency services. Dehydration was considered a potential factor, and she is trying to drink more water but finds it challenging. She regularly  participates in exercise classes.  A transesophageal echocardiogram and carotid ultrasound were performed, both normal. Cardiac enzymes and EKG were also normal. She is on blood pressure medication but has not been monitoring her blood pressure at home due to an ill-fitting cuff.  She reports a recent fall at home, resulting in a swollen left ankle and knee. The ankle is swollen and semi-numb, but she can bear weight on it. She has been icing the area and is considering an ankle brace. Her knee is still swollen.   She currently takes 600 mg but was advised to take 1200 mg. She is seeking chewable options due to difficulty swallowing pills.  She mentions a history of headaches in the summer, which resolved after discontinuing tart cherry juice, which she discovered contained sorbitol, a headache trigger for her.  She is on levothyroxine  for hypothyroidism, which was noted during her hospital stay. She plans to have her thyroid  levels rechecked in three months.     5 staples removed during encounter.   PHQ score 13. Could not tolerate past trial of SSRI. Declined antidepressant or therapy today.   Review of Systems:  Review of Systems  Constitutional: Negative.   HENT: Negative.    Respiratory: Negative.    Cardiovascular: Negative.   Gastrointestinal: Negative.   Genitourinary: Negative.   Musculoskeletal:  Positive for falls and joint pain.  Skin: Negative.   Neurological:  Negative for dizziness and headaches.  Psychiatric/Behavioral:  Positive for depression. The patient is not nervous/anxious.  Past Medical History:  Diagnosis Date   High cholesterol    History of bone density study    History of mammogram 2021   Hypertension    Hypothyroidism    left Toe pain 12/11/2020   Per patient   Past Surgical History:  Procedure Laterality Date   OTHER SURGICAL HISTORY     Cataracts Surgery by Dr.Hecker   XI ROBOTIC ASSISTED HIATAL HERNIA REPAIR N/A 09/23/2021   Procedure: XI  ROBOTIC ASSISTED HIATAL HERNIA REPAIR TOUPET FUNDOPLICATION, MESH REINFORCEMENT;  Surgeon: Sheldon Standing, MD;  Location: [facility];  Service: General;  Laterality: N/A;   Social History:   reports that she has never smoked. She has never used smokeless tobacco. She reports that she does not drink alcohol and does not use drugs.  Family History  Problem Relation Age of Onset   Cancer Mother    Congestive Heart Failure Father     Medications: Patient's Medications  New Prescriptions   No medications on file  Previous Medications   ACETAMINOPHEN  (TYLENOL ) 500 MG TABLET    Take 1,000 mg by mouth as needed for mild pain (pain score 1-3) or moderate pain (pain score 4-6).   ASPIRIN  81 MG CHEWABLE TABLET    Chew 81 mg by mouth at bedtime.   CALCIUM  CARBONATE-VIT D-MIN (CALCIUM  1200 PO)    Take 1 tablet by mouth daily with lunch. Chew tablet   GABAPENTIN  (NEURONTIN ) 100 MG CAPSULE    Take 2 capsules (200 mg total) by mouth at bedtime.   LEVOTHYROXINE  (SYNTHROID ) 88 MCG TABLET    Take 1 tablet by mouth once daily   LISINOPRIL -HYDROCHLOROTHIAZIDE  (ZESTORETIC ) 10-12.5 MG TABLET    Take 1 tablet by mouth once daily   LOPERAMIDE (IMODIUM A-D) 2 MG TABLET    Take 2 mg by mouth as needed for diarrhea or loose stools.   MECLIZINE (ANTIVERT) 25 MG TABLET    Take 1 tablet (25 mg total) by mouth 3 (three) times daily as needed for dizziness.   MULTIPLE VITAMINS-MINERALS (MULTIVITAMIN GUMMIES ADULT PO)    Take 1 tablet by mouth daily with lunch. Chew tablet   SIMVASTATIN  (ZOCOR ) 20 MG TABLET    TAKE 1 TABLET BY MOUTH ONCE DAILY AT  6PM   TURMERIC 500 MG TABS    Take 500 mg by mouth at bedtime. Chew tablet  Modified Medications   No medications on file  Discontinued Medications   No medications on file    Physical Exam:  Vitals:   01/10/24 0943  BP: 114/74  Pulse: 72  Temp: 98 F (36.7 C)  SpO2: 99%  Weight: 125 lb 9.6 oz (57 kg)  Height: 5' 1 (1.549 m)   Body mass index is 23.73 kg/m. Wt  Readings from Last 3 Encounters:  01/10/24 125 lb 9.6 oz (57 kg)  12/28/23 123 lb 3.8 oz (55.9 kg)  06/28/23 126 lb 9.6 oz (57.4 kg)    Physical Exam Vitals reviewed.  HENT:     Head: Normocephalic. Abrasion present. Hair is abnormal.     Comments: 5 staple removed without difficulty Eyes:     General:        Right eye: No discharge.        Left eye: No discharge.  Cardiovascular:     Rate and Rhythm: Normal rate and regular rhythm.     Pulses: Normal pulses.     Heart sounds: Normal heart sounds.  Pulmonary:     Effort: Pulmonary effort is normal.  Breath sounds: Normal breath sounds.  Abdominal:     General: Bowel sounds are normal.     Palpations: Abdomen is soft.  Musculoskeletal:     Cervical back: Neck supple.     Right lower leg: No edema.     Left lower leg: No edema.     Right ankle: Swelling present. Tenderness present. Normal range of motion. Normal pulse.  Skin:    General: Skin is warm.     Capillary Refill: Capillary refill takes less than 2 seconds.  Neurological:     General: No focal deficit present.     Mental Status: She is alert and oriented to person, place, and time.  Psychiatric:        Mood and Affect: Mood normal.     Labs reviewed: Basic Metabolic Panel: Recent Labs    06/28/23 1415 12/27/23 0925 12/28/23 0500 12/28/23 1855 12/29/23 0651  NA 139 136 137  --  134*  K 4.7 4.0 3.9  --  4.2  CL 105 102 102  --  99  CO2 27 23 23   --  26  GLUCOSE 99 108* 90  --  92  BUN 25 22 19   --  23  CREATININE 0.79 0.72 0.65  --  0.83  CALCIUM  9.2 9.3 9.1  --  9.0  MG  --   --  2.0  --  2.1  TSH 1.32  --   --  6.580*  --    Liver Function Tests: Recent Labs    06/28/23 1415  AST 17  ALT 14  BILITOT 0.2  PROT 6.6   No results for input(s): LIPASE, AMYLASE in the last 8760 hours. No results for input(s): AMMONIA in the last 8760 hours. CBC: Recent Labs    06/28/23 1415 12/27/23 0925 12/28/23 0500 12/29/23 0651  WBC 7.2  10.8* 8.0 6.6  NEUTROABS 4,241 8.8*  --   --   HGB 12.5 12.2 13.1 13.6  HCT 37.9 38.3 41.8 42.4  MCV 95.2 96.5 95.9 94.9  PLT 237 180 170 218   Lipid Panel: No results for input(s): CHOL, HDL, LDLCALC, TRIG, CHOLHDL, LDLDIRECT in the last 8760 hours. TSH: Recent Labs    06/28/23 1415 12/28/23 1855  TSH 1.32 6.580*   A1C: No results found for: HGBA1C   Assessment/Plan 1. Benign paroxysmal positional vertigo of right ear (Primary) - hospitalized 10/16-10/19 - syncopal event with head injury> 5 staples - CT head negative for acute intracranial abnormality - TEE and carotid U/S unremarkable - ? Dehydration as underlying cause - cont Dix Hallpike maneuver prn - discussed adequate hydration with water for prevention  2. Injury of head, subsequent encounter - see above - 5 staples removed - may wash hair in shower - CBC with Differential/Platelet - Basic Metabolic Panel with eGFR  3. Acute right ankle pain - fall after hospital discharge  - WBAT, mild swelling and tenderness  - cont tylenol  prn for pain  - cont ice applications  4. Essential hypertension - controlled - see above - take blood pressures BID x 14 days and bring readings - cont lisinopril -hydrochlorothiazide > ? Dehydration/vertigo  5. Acquired hypothyroidism - TSH 6.580 - cont levothyroxine   - recheck level in 3 months   6. Pure hypercholesterolemia - on simvastatin  - Lipid Panel; Future  Total time: 32 minutes. Greater than 50% of total time spent doing patient education regarding health maintenance, vertigo, recent hospitalization, HTN, HLD and hypothyroidism including symptom/medication management.    Next appt:  04/17/2024  Inesha Sow Gil BODILY  [facility] [phone]

## 2024-01-11 ENCOUNTER — Ambulatory Visit: Payer: Self-pay | Admitting: Orthopedic Surgery

## 2024-01-11 LAB — CBC WITH DIFFERENTIAL/PLATELET
Absolute Lymphocytes: 1848 {cells}/uL (ref 850–3900)
Absolute Monocytes: 632 {cells}/uL (ref 200–950)
Basophils Absolute: 72 {cells}/uL (ref 0–200)
Basophils Relative: 0.9 %
Eosinophils Absolute: 200 {cells}/uL (ref 15–500)
Eosinophils Relative: 2.5 %
HCT: 41.3 % (ref 35.0–45.0)
Hemoglobin: 13.1 g/dL (ref 11.7–15.5)
MCH: 30.6 pg (ref 27.0–33.0)
MCHC: 31.7 g/dL — ABNORMAL LOW (ref 32.0–36.0)
MCV: 96.5 fL (ref 80.0–100.0)
MPV: 10.7 fL (ref 7.5–12.5)
Monocytes Relative: 7.9 %
Neutro Abs: 5248 {cells}/uL (ref 1500–7800)
Neutrophils Relative %: 65.6 %
Platelets: 249 Thousand/uL (ref 140–400)
RBC: 4.28 Million/uL (ref 3.80–5.10)
RDW: 12.8 % (ref 11.0–15.0)
Total Lymphocyte: 23.1 %
WBC: 8 Thousand/uL (ref 3.8–10.8)

## 2024-01-11 LAB — BASIC METABOLIC PANEL WITH GFR
BUN/Creatinine Ratio: 34 (calc) — ABNORMAL HIGH (ref 6–22)
BUN: 31 mg/dL — ABNORMAL HIGH (ref 7–25)
CO2: 30 mmol/L (ref 20–32)
Calcium: 9.2 mg/dL (ref 8.6–10.4)
Chloride: 101 mmol/L (ref 98–110)
Creat: 0.92 mg/dL (ref 0.60–1.00)
Glucose, Bld: 91 mg/dL (ref 65–139)
Potassium: 4.8 mmol/L (ref 3.5–5.3)
Sodium: 137 mmol/L (ref 135–146)
eGFR: 64 mL/min/1.73m2 (ref >=60)

## 2024-01-14 ENCOUNTER — Telehealth: Payer: Self-pay

## 2024-01-14 ENCOUNTER — Ambulatory Visit: Admitting: Family

## 2024-01-14 ENCOUNTER — Encounter: Payer: Self-pay | Admitting: Family

## 2024-01-14 VITALS — BP 118/72 | HR 69 | Temp 97.6°F | Resp 18 | Ht 61.0 in | Wt 127.6 lb

## 2024-01-14 DIAGNOSIS — S0990XD Unspecified injury of head, subsequent encounter: Secondary | ICD-10-CM

## 2024-01-14 DIAGNOSIS — R2681 Unsteadiness on feet: Secondary | ICD-10-CM

## 2024-01-14 NOTE — Telephone Encounter (Signed)
 Copied from CRM [id number]. Topic: Appointments - Scheduling Inquiry for Clinic >> Jan 14, 2024 10:37 AM Alfonso ORN wrote: Reason for CRM: patient seen amy fargo last thursday to remove staples from back of head patient stated , some of the staples may have been missed, she can feel staples in back of her head , can physically feel a staples in one spot  Patient would like to come in today , and willing to see anyone   ----------------------------------------------------------------------- From previous Reason for Contact - Scheduling: Patient/patient representative is calling to schedule an appointment. Refer to attachments for appointment information. >> Jan 14, 2024 11:06 AM Darice BIRCH wrote: Patient scheduled this afternoon

## 2024-01-20 NOTE — Progress Notes (Signed)
 Provider: Patrecia Veiga FNP-C  Gil Greig BRAVO, NP  Patient Care Team: Gil Greig BRAVO, NP as PCP - General (Adult Health Nurse Practitioner) Cleatus Collar, MD as Consulting Physician (Ophthalmology) Sheldon Standing, MD as Consulting Physician (General Surgery)  Extended Emergency Contact Information Primary Emergency Contact: [facility] Phone: [phone] Relation: Relative Secondary Emergency Contact: Barnette,Francis Mobile Phone: [phone] Relation: Relative  Code Status:  DNR Goals of care: Advanced Directive information    12/27/2023    9:03 AM  Advanced Directives  Does Patient Have a Medical Advance Directive? No  Would patient like information on creating a medical advance directive? No - Patient declined     Chief Complaint  Patient presents with   Suture / Staple Removal     Discussed the use of AI scribe software for clinical note transcription with the patient, who gave verbal consent to proceed.  History of Present Illness   Terri Mcgee is a 78 year old female who presents with concerns about a possible missed staple in her scalp following recent staple removal.  She was hospitalized from October 16th to 20th, during which staples were placed in her scalp. The exact date of staple placement is unclear, but they were removed on October 30th. Over the weekend following the removal, she noticed a spot on the back of her head that felt like a staple, which she could not see herself.  There is no pain or headaches associated with the area, although it was initially sore. She has been washing her hair daily since the staple removal and has been unable to remove a knot in her hair despite using conditioner and brushing. She is concerned about a spot on her scalp, which she initially thought might be a missed staple.  She has been experiencing frequent falls recently and is using her phone more often due to needing to replace her smartwatch.   Past  Medical History:  Diagnosis Date   High cholesterol    History of bone density study    History of mammogram 2021   Hypertension    Hypothyroidism    left Toe pain 12/11/2020   Per patient   Past Surgical History:  Procedure Laterality Date   OTHER SURGICAL HISTORY     Cataracts Surgery by Dr.Hecker   XI ROBOTIC ASSISTED HIATAL HERNIA REPAIR N/A 09/23/2021   Procedure: XI ROBOTIC ASSISTED HIATAL HERNIA REPAIR TOUPET FUNDOPLICATION, MESH REINFORCEMENT;  Surgeon: Sheldon Standing, MD;  Location: [facility];  Service: General;  Laterality: N/A;    Allergies  Allergen Reactions   Penicillins Shortness Of Breath and Rash    As a child   Terbinafine  And Related     Headache, blurred vision, dizziness   Tetanus Toxoid Rash   Tetanus Toxoid Adsorbed Rash    Outpatient Encounter Medications as of 01/14/2024  Medication Sig   acetaminophen  (TYLENOL ) 500 MG tablet Take 1,000 mg by mouth as needed for mild pain (pain score 1-3) or moderate pain (pain score 4-6).   aspirin  81 MG chewable tablet Chew 81 mg by mouth at bedtime.   Calcium  Carbonate-Vit D-Min (CALCIUM  1200 PO) Take 1 tablet by mouth daily with lunch. Chew tablet   gabapentin  (NEURONTIN ) 100 MG capsule Take 2 capsules (200 mg total) by mouth at bedtime.   levothyroxine  (SYNTHROID ) 88 MCG tablet Take 1 tablet by mouth once daily   lisinopril -hydrochlorothiazide  (ZESTORETIC ) 10-12.5 MG tablet Take 1 tablet by mouth once daily   loperamide (IMODIUM A-D) 2 MG tablet Take  2 mg by mouth as needed for diarrhea or loose stools.   meclizine (ANTIVERT) 25 MG tablet Take 1 tablet (25 mg total) by mouth 3 (three) times daily as needed for dizziness.   Multiple Vitamins-Minerals (MULTIVITAMIN GUMMIES ADULT PO) Take 1 tablet by mouth daily with lunch. Chew tablet   simvastatin  (ZOCOR ) 20 MG tablet TAKE 1 TABLET BY MOUTH ONCE DAILY AT  6PM   Turmeric 500 MG TABS Take 500 mg by mouth at bedtime. Chew tablet   No facility-administered encounter  medications on file as of 01/14/2024.    Review of Systems  Constitutional:  Negative for appetite change, chills, fatigue, fever and unexpected weight change.  Eyes:  Negative for pain, discharge, redness, itching and visual disturbance.  Respiratory:  Negative for cough, chest tightness, shortness of breath and wheezing.   Cardiovascular:  Negative for chest pain, palpitations and leg swelling.  Gastrointestinal:  Negative for abdominal distention, abdominal pain, constipation, diarrhea, nausea and vomiting.  Musculoskeletal:  Positive for gait problem. Negative for arthralgias, back pain, joint swelling, myalgias, neck pain and neck stiffness.  Skin:  Negative for color change, pallor, rash and wound.  Neurological:  Negative for dizziness, syncope, weakness, light-headedness, numbness and headaches.  Hematological:  Does not bruise/bleed easily.    Immunization History  Administered Date(s) Administered   Fluad Quad(high Dose 65+) 01/08/2020   Fluad Trivalent(High Dose 65+) 12/14/2022   Fluzone  Influenza virus vaccine,trivalent (IIV3), split virus 11/20/2011, 11/28/2012   INFLUENZA, HIGH DOSE SEASONAL PF 11/11/2020, 01/17/2024   Influenza,inj,quad, With Preservative 11/28/2012, 01/03/2017   Influenza-Unspecified 11/20/2011, 12/06/2018   PFIZER Comirnaty(Gray Top)Covid-19 Tri-Sucrose Vaccine 07/27/2020   PFIZER(Purple Top)SARS-COV-2 Vaccination 05/04/2019, 05/28/2019, 11/28/2019, 07/27/2020, 12/31/2020   PNEUMOCOCCAL CONJUGATE-20 11/19/2021   Pfizer Covid-19 Vaccine Bivalent Booster 73yrs & up 01/15/2022   Pneumococcal Polysaccharide-23 11/20/2011   Pneumococcal-Unspecified 11/20/2011   Zoster Recombinant(Shingrix) 11/02/2017, 01/11/2018   Zoster, Live 11/12/2009   Pertinent  Health Maintenance Due  Topic Date Due   Influenza Vaccine  Completed   DEXA SCAN  Completed   Mammogram  Discontinued      12/14/2022    1:58 PM 12/14/2022    2:34 PM 01/04/2023    8:56 AM 06/28/2023     1:32 PM 01/10/2024   10:03 AM  Fall Risk  Falls in the past year? 1 1 1 1 1   Was there an injury with Fall? 1 1 0 0 1  Fall Risk Category Calculator 2 2 1 1 2   Patient at Risk for Falls Due to Impaired balance/gait;Impaired mobility History of fall(s);Impaired balance/gait  History of fall(s) No Fall Risks  Fall risk Follow up Falls evaluation completed;Education provided;Falls prevention discussed Falls evaluation completed;Education provided;Falls prevention discussed  Falls evaluation completed Falls evaluation completed   Functional Status Survey:    Vitals:   01/14/24 1441  BP: 118/72  Pulse: 69  Resp: 18  Temp: 97.6 F (36.4 C)  TempSrc: Temporal  SpO2: 97%  Weight: 127 lb 9.6 oz (57.9 kg)  Height: 5' 1 (1.549 m)   Body mass index is 24.11 kg/m. Physical Exam  GENERAL: Alert, cooperative, well developed, no acute distress. HEENT: Normocephalic, normal oropharynx, moist mucous membranes. CHEST: Clear to auscultation bilaterally, no wheezes, rhonchi, or crackles. CARDIOVASCULAR: Normal heart rate and rhythm, S1 and S2 normal without murmurs. ABDOMEN: Soft, non-tender, non-distended, without organomegaly, normal bowel sounds. EXTREMITIES: No cyanosis or edema. NEUROLOGICAL: Cranial nerves grossly intact, moves all extremities without gross motor or sensory deficit. SKIN: Scab present on the  back of the head, no redness.   Labs reviewed: Recent Labs    12/28/23 0500 12/29/23 0651 01/10/24 1100  NA 137 134* 137  K 3.9 4.2 4.8  CL 102 99 101  CO2 23 26 30   GLUCOSE 90 92 91  BUN 19 23 31*  CREATININE 0.65 0.83 0.92  CALCIUM  9.1 9.0 9.2  MG 2.0 2.1  --    Recent Labs    06/28/23 1415  AST 17  ALT 14  BILITOT 0.2  PROT 6.6   Recent Labs    06/28/23 1415 12/27/23 0925 12/28/23 0500 12/29/23 0651 01/10/24 1100  WBC 7.2 10.8* 8.0 6.6 8.0  NEUTROABS 4,241 8.8*  --   --  5,248  HGB 12.5 12.2 13.1 13.6 13.1  HCT 37.9 38.3 41.8 42.4 41.3  MCV 95.2 96.5  95.9 94.9 96.5  PLT 237 180 170 218 249   Lab Results  Component Value Date   TSH 6.580 (H) 12/28/2023   No results found for: HGBA1C Lab Results  Component Value Date   CHOL 134 01/04/2023   HDL 59 01/04/2023   LDLCALC 51 01/04/2023   TRIG 165 (H) 01/04/2023   CHOLHDL 2.3 01/04/2023    Significant Diagnostic Results in last 30 days:  ECHOCARDIOGRAM COMPLETE Result Date: 12/28/2023    ECHOCARDIOGRAM REPORT   Patient Name:   Alysiana C Ueda Date of Exam: 12/28/2023 Medical Rec #:  [id number]        Height:       61.0 in Accession #:    [id number]       Weight:       126.6 lb Date of Birth:  07/21/1945         BSA:          1.555 m Patient Age:    78 years         BP:           118/95 mmHg Patient Gender: F                HR:           76 bpm. Exam Location:  Inpatient Procedure: 2D Echo, Cardiac Doppler and Color Doppler (Both Spectral and Color            Flow Doppler were utilized during procedure). Indications:    Syncope R55  History:        Patient has no prior history of Echocardiogram examinations.                 Risk Factors:Hypertension.  Sonographer:    Jayson Gaskins Referring Phys: [phone] CLAUDIA CLAIBORNE IMPRESSIONS  1. Left ventricular ejection fraction, by estimation, is 70 to 75%. The left ventricle has hyperdynamic function. The left ventricle has no regional wall motion abnormalities. Left ventricular diastolic parameters are indeterminate.  2. Right ventricular systolic function is normal. The right ventricular size is normal. There is mildly elevated pulmonary artery systolic pressure.  3. Left atrial size was mildly dilated.  4. The mitral valve is myxomatous. Trivial mitral valve regurgitation.  5. The aortic valve is calcified. Aortic valve regurgitation is not visualized. Velocities trough the aortic valve increased due to hyperdynamic state.  6. There is Moderate (Grade III) plaque involving the ascending aorta.  7. The inferior vena cava is normal in size with <50%  respiratory variability, suggesting right atrial pressure of 8 mmHg. Comparison(s): No prior Echocardiogram. FINDINGS  Left Ventricle: Left ventricular ejection fraction, by estimation, is 70  to 75%. The left ventricle has hyperdynamic function. The left ventricle has no regional wall motion abnormalities. The left ventricular internal cavity size was normal in size. There is no left ventricular hypertrophy. Left ventricular diastolic parameters are indeterminate. Right Ventricle: The right ventricular size is normal. No increase in right ventricular wall thickness. Right ventricular systolic function is normal. There is mildly elevated pulmonary artery systolic pressure. The tricuspid regurgitant velocity is 2.82  m/s, and with an assumed right atrial pressure of 8 mmHg, the estimated right ventricular systolic pressure is 39.8 mmHg. Left Atrium: Left atrial size was mildly dilated. Right Atrium: Right atrial size was normal in size. Pericardium: There is no evidence of pericardial effusion. Mitral Valve: The mitral valve is myxomatous. There is moderate thickening of the mitral valve leaflet(s). Trivial mitral valve regurgitation. Tricuspid Valve: The tricuspid valve is grossly normal. Tricuspid valve regurgitation is mild. Aortic Valve: The aortic valve is calcified. Aortic valve regurgitation is not visualized. Velocities trough the aortic valve increased due to hyperdynamic state. Aortic valve mean gradient measures 12.0 mmHg. Aortic valve peak gradient measures 23.8 mmHg. Aortic valve area, by VTI measures 1.42 cm. Pulmonic Valve: The pulmonic valve was grossly normal. Pulmonic valve regurgitation is not visualized. Aorta: The aortic root is normal in size and structure. There is moderate (Grade III) plaque involving the ascending aorta. Venous: The inferior vena cava is normal in size with less than 50% respiratory variability, suggesting right atrial pressure of 8 mmHg. IAS/Shunts: No atrial level shunt  detected by color flow Doppler.  LEFT VENTRICLE PLAX 2D LVIDd:         2.80 cm   Diastology LVIDs:         1.90 cm   LV e' medial:    8.49 cm/s LV PW:         0.90 cm   LV E/e' medial:  12.0 LV IVS:        1.10 cm   LV e' lateral:   8.16 cm/s LVOT diam:     1.70 cm   LV E/e' lateral: 12.5 LV SV:         57 LV SV Index:   37 LVOT Area:     2.27 cm  RIGHT VENTRICLE RV S prime:     12.20 cm/s TAPSE (M-mode): 2.5 cm LEFT ATRIUM             Index        RIGHT ATRIUM          Index LA Vol (A2C):   27.4 ml 17.62 ml/m  RA Area:     9.47 cm LA Vol (A4C):   57.5 ml 36.97 ml/m  RA Volume:   18.90 ml 12.15 ml/m LA Biplane Vol: 41.3 ml 26.56 ml/m  AORTIC VALVE AV Area (Vmax):    1.38 cm AV Area (Vmean):   1.52 cm AV Area (VTI):     1.42 cm AV Vmax:           244.00 cm/s AV Vmean:          134.000 cm/s AV VTI:            0.402 m AV Peak Grad:      23.8 mmHg AV Mean Grad:      12.0 mmHg LVOT Vmax:         148.00 cm/s LVOT Vmean:        89.500 cm/s LVOT VTI:          0.252  m LVOT/AV VTI ratio: 0.63  AORTA Ao Root diam: 2.40 cm MITRAL VALVE                TRICUSPID VALVE MV Area (PHT): 3.34 cm     TR Peak grad:   31.8 mmHg MV Decel Time: 227 msec     TR Vmax:        282.00 cm/s MV E velocity: 102.00 cm/s MV A velocity: 102.00 cm/s  SHUNTS MV E/A ratio:  1.00         Systemic VTI:  0.25 m                             Systemic Diam: 1.70 cm Emeline Calender Electronically signed by Emeline Calender Signature Date/Time: 12/28/2023/4:59:03 PM    Final    VAS US  CAROTID Result Date: 12/28/2023 Carotid Arterial Duplex Study Patient Name:  LANDRY LOOKINGBILL  Date of Exam:   12/28/2023 Medical Rec #: [id number]         Accession #:    [id number] Date of Birth: 26-Feb-1946          Patient Gender: F Patient Age:   39 years Exam Location:  [facility] Procedure:      VAS US  CAROTID Referring Phys: WILL BERNARD --------------------------------------------------------------------------------  Indications:       Syncope, Weakness and  dizziness. Risk Factors:      Hypertension, hyperlipidemia. Comparison Study:  No priors. Performing Technologist: Ricka Sturdivant-Jones RDMS, RVT  Examination Guidelines: A complete evaluation includes B-mode imaging, spectral Doppler, color Doppler, and power Doppler as needed of all accessible portions of each vessel. Bilateral testing is considered an integral part of a complete examination. Limited examinations for reoccurring indications may be performed as noted.  Right Carotid Findings: +----------+--------+--------+--------+------------------+------------------+           PSV cm/sEDV cm/sStenosisPlaque DescriptionComments           +----------+--------+--------+--------+------------------+------------------+ CCA Prox  147     17                                tortuous           +----------+--------+--------+--------+------------------+------------------+ CCA Distal119     23                                intimal thickening +----------+--------+--------+--------+------------------+------------------+ ICA Prox  71      21      1-39%                     intimal thickening +----------+--------+--------+--------+------------------+------------------+ ICA Mid   83      17                                                   +----------+--------+--------+--------+------------------+------------------+ ICA Distal70      19                                tortuous           +----------+--------+--------+--------+------------------+------------------+ ECA       119     10                                                   +----------+--------+--------+--------+------------------+------------------+ +----------+--------+-------+----------------+-------------------+  PSV cm/sEDV cmsDescribe        Arm Pressure (mmHG) +----------+--------+-------+----------------+-------------------+ Subclavian200            Multiphasic, WNL                     +----------+--------+-------+----------------+-------------------+ +---------+--------+--+--------+--+---------+ VertebralPSV cm/s71EDV cm/s18Antegrade +---------+--------+--+--------+--+---------+  Left Carotid Findings: +----------+--------+--------+--------+------------------+------------------+           PSV cm/sEDV cm/sStenosisPlaque DescriptionComments           +----------+--------+--------+--------+------------------+------------------+ CCA Prox  114     18                                                   +----------+--------+--------+--------+------------------+------------------+ CCA Distal71      14                                intimal thickening +----------+--------+--------+--------+------------------+------------------+ ICA Prox  110     21      1-39%   calcific                             +----------+--------+--------+--------+------------------+------------------+ ICA Distal102     23                                                   +----------+--------+--------+--------+------------------+------------------+ ECA       95      11                                                   +----------+--------+--------+--------+------------------+------------------+ +----------+--------+--------+----------------+-------------------+           PSV cm/sEDV cm/sDescribe        Arm Pressure (mmHG) +----------+--------+--------+----------------+-------------------+ [id number]             Multiphasic, WNL                    +----------+--------+--------+----------------+-------------------+ +---------+--------+--+--------+--+---------+ VertebralPSV cm/s76EDV cm/s22Antegrade +---------+--------+--+--------+--+---------+   Summary: Right Carotid: Velocities in the right ICA are consistent with a 1-39% stenosis. Left Carotid: Velocities in the left ICA are consistent with a 1-39% stenosis. Vertebrals:  Bilateral vertebral arteries demonstrate  antegrade flow. Subclavians: Normal flow hemodynamics were seen in bilateral subclavian              arteries. *See table(s) above for measurements and observations.  Electronically signed by Lonni Gaskins MD on 12/28/2023 at 11:57:40 AM.    Final    MR BRAIN WO CONTRAST Result Date: 12/27/2023 EXAM: MRI BRAIN WITHOUT CONTRAST 12/27/2023 03:05:00 PM TECHNIQUE: Multiplanar multisequence MRI of the head/brain was performed without the administration of intravenous contrast. COMPARISON: Same day CT head. CLINICAL HISTORY: Headache, neuro deficit; new onset vertigo with horizontal nystagmus post syncopal episode. FINDINGS: BRAIN AND VENTRICLES: No acute infarct. No intracranial hemorrhage. No mass. No midline shift. No hydrocephalus. Scattered T2 FLAIR hyperintensity in the periventricular and subcortical white matter likely reflecting mild chronic microvascular ischemic changes. Mild parenchymal volume loss. Prominent perivascular spaces in  the bilateral basal ganglia. 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 The sella is unremarkable. Normal flow voids. ORBITS: Bilateral lens replacement. SINUSES AND MASTOIDS: No acute abnormality. BONES AND SOFT TISSUES: Normal marrow signal. No acute soft tissue abnormality. Degenerative changes in the visualized upper cervical spine. There is trace spondylolisthesis of C3 on C4 likely related to degenerative changes. IMPRESSION: 1. No acute intracranial abnormality. 2. Mild chronic microvascular ischemic changes. 3. 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 4. Degenerative changes in the visualized upper cervical spine with trace C3 on C4 spondylolisthesis likely degenerative. Electronically signed by: Donnice Mania MD 12/27/2023 04:34 PM EDT RP Workstation: [id number]   CT Head Wo Contrast Result Date: 12/27/2023 CLINICAL DATA:  Syncopal episode with a fall.  Head and neck trauma. EXAM: CT HEAD WITHOUT CONTRAST CT CERVICAL SPINE WITHOUT CONTRAST TECHNIQUE:  Multidetector CT imaging of the head and cervical spine was performed following the standard protocol without intravenous contrast. Multiplanar CT image reconstructions of the cervical spine were also generated. RADIATION DOSE REDUCTION: This exam was performed according to the departmental dose-optimization program which includes automated exposure control, adjustment of the mA and/or kV according to patient size and/or use of iterative reconstruction technique. COMPARISON:  None Available. FINDINGS: CT HEAD FINDINGS Brain: There is no evidence for acute hemorrhage, hydrocephalus, mass lesion, or abnormal extra-axial fluid collection. No definite CT evidence for acute infarction. Vascular: No hyperdense vessel or unexpected calcification. Skull: No evidence for fracture. No worrisome lytic or sclerotic lesion. Sinuses/Orbits: The visualized paranasal sinuses and mastoid air cells are clear. Visualized portions of the globes and intraorbital fat are unremarkable. Other: None. CT CERVICAL SPINE FINDINGS Alignment: Trace degenerative spondylolisthesis is seen at C3-4 and C4-5. Skull base and vertebrae: No acute fracture. No primary bone lesion or focal pathologic process. Soft tissues and spinal canal: No prevertebral fluid or swelling. No visible canal hematoma. Disc levels: Loss of disc height noted C3-4 and C5-6. Marked loss of disc height at C6-7 evident with associated endplate degeneration. Diffuse facet osteoarthropathy noted bilaterally. Upper chest: No acute findings. Other: None. IMPRESSION: 1. No acute intracranial abnormality. 2. Degenerative changes in the cervical spine without fracture. Electronically Signed   By: Camellia Candle M.D.   On: 12/27/2023 10:28   CT Cervical Spine Wo Contrast Result Date: 12/27/2023 CLINICAL DATA:  Syncopal episode with a fall.  Head and neck trauma. EXAM: CT HEAD WITHOUT CONTRAST CT CERVICAL SPINE WITHOUT CONTRAST TECHNIQUE: Multidetector CT imaging of the head and  cervical spine was performed following the standard protocol without intravenous contrast. Multiplanar CT image reconstructions of the cervical spine were also generated. RADIATION DOSE REDUCTION: This exam was performed according to the departmental dose-optimization program which includes automated exposure control, adjustment of the mA and/or kV according to patient size and/or use of iterative reconstruction technique. COMPARISON:  None Available. FINDINGS: CT HEAD FINDINGS Brain: There is no evidence for acute hemorrhage, hydrocephalus, mass lesion, or abnormal extra-axial fluid collection. No definite CT evidence for acute infarction. Vascular: No hyperdense vessel or unexpected calcification. Skull: No evidence for fracture. No worrisome lytic or sclerotic lesion. Sinuses/Orbits: The visualized paranasal sinuses and mastoid air cells are clear. Visualized portions of the globes and intraorbital fat are unremarkable. Other: None. CT CERVICAL SPINE FINDINGS Alignment: Trace degenerative spondylolisthesis is seen at C3-4 and C4-5. Skull base and vertebrae: No acute fracture. No primary bone lesion or focal pathologic process. Soft tissues and spinal canal: No prevertebral fluid or swelling. No visible canal hematoma. Disc  levels: Loss of disc height noted C3-4 and C5-6. Marked loss of disc height at C6-7 evident with associated endplate degeneration. Diffuse facet osteoarthropathy noted bilaterally. Upper chest: No acute findings. Other: None. IMPRESSION: 1. No acute intracranial abnormality. 2. Degenerative changes in the cervical spine without fracture. Electronically Signed   By: Camellia Candle M.D.   On: 12/27/2023 10:28   DG Chest 2 View Result Date: 12/27/2023 EXAM: 2 VIEW(S) XRAY OF THE CHEST 12/27/2023 10:11:00 AM COMPARISON: 09/22/2021 CLINICAL HISTORY: syncope with L hand pain. Pt states syncope and is having pain in her first digit on her left hand which radiates down into her wrist. ; Per chart Pt  BIBA from home for syncopal fall. Pt reports feeling dizzy when standing to go to restroom this am then woke up on the bathroom floor with laceration to posterior midline of head. ; Pt was unable to remove rings for imaging FINDINGS: LUNGS AND PLEURA: No focal pulmonary opacity. No pulmonary edema. No pleural effusion. No pneumothorax. HEART AND MEDIASTINUM: No acute abnormality of the cardiac silhouette. BONES AND SOFT TISSUES: No acute osseous abnormality. IMPRESSION: 1. No acute cardiopulmonary process. Electronically signed by: Lynwood Seip MD 12/27/2023 10:23 AM EDT RP Workstation: [id number]   DG Hand Complete Left Result Date: 12/27/2023 CLINICAL DATA:  Pain in her first digit of the left hand. EXAM: LEFT HAND - COMPLETE 3+ VIEW COMPARISON:  None Available. FINDINGS: No evidence for an acute fracture. No dislocation. Marked widening of the scapholunate distance is compatible dissociation. IMPRESSION: 1. No acute bony abnormality. 2. 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 Electronically Signed   By: Camellia Candle M.D.   On: 12/27/2023 10:23    Assessment/Plan  Healing scalp wound with scab, post-staple removal Healing scalp wound with a scab at the site of previous staple removal. No staples present. No redness or signs of infection. Mild soreness without headache. Scab is dry and may cause discomfort if manipulated. - Advised gentle handling of the scab during hair coloring to prevent bleeding. - Recommended postponing hair coloring until the following week if possible to allow further healing. - Instructed to monitor for signs of infection such as pain or drainage. - Encouraged keeping the area moist to aid healing.   Unsteady gait  - Fall and safety precaution   Family/ staff Communication: Reviewed plan of care with patient verbalized understanding   Labs/tests ordered: None   Next Appointment: Return if symptoms worsen or fail to  improve.   Total time: 20 minutes. Greater than 50% of total time spent doing patient education regarding unsteady gait,Healing scab wound and health maintenance including symptom/medication management.   Roxan JAYSON Plough, NP

## 2024-02-14 ENCOUNTER — Other Ambulatory Visit: Payer: Self-pay | Admitting: Orthopedic Surgery

## 2024-02-14 DIAGNOSIS — G8929 Other chronic pain: Secondary | ICD-10-CM

## 2024-02-22 ENCOUNTER — Other Ambulatory Visit: Payer: Self-pay | Admitting: Orthopedic Surgery

## 2024-02-22 DIAGNOSIS — I10 Essential (primary) hypertension: Secondary | ICD-10-CM

## 2024-02-22 DIAGNOSIS — E039 Hypothyroidism, unspecified: Secondary | ICD-10-CM

## 2024-03-11 LAB — HM MAMMOGRAPHY

## 2024-03-12 ENCOUNTER — Encounter: Payer: Self-pay | Admitting: Orthopedic Surgery

## 2024-03-12 ENCOUNTER — Other Ambulatory Visit: Payer: Self-pay | Admitting: Orthopedic Surgery

## 2024-03-12 DIAGNOSIS — M545 Low back pain, unspecified: Secondary | ICD-10-CM

## 2024-03-14 ENCOUNTER — Ambulatory Visit: Payer: Self-pay | Admitting: Orthopedic Surgery

## 2024-03-14 NOTE — Telephone Encounter (Signed)
 Copied from CRM [id number]. Topic: Clinical - Lab/Test Results >> Mar 14, 2024  2:31 PM Terri Mcgee ORN wrote: Reason for CRM: Patient returning call stating that someone called her around lunch time. Checked chart and it was the CMA calling patient about mammogram results. Asked patient if she had already spoken with someone & she stated no. Chart note states pt was notified. Informed patient that per chart note, her results were negative.

## 2024-03-16 ENCOUNTER — Other Ambulatory Visit: Payer: Self-pay | Admitting: Orthopedic Surgery

## 2024-03-16 DIAGNOSIS — E7800 Pure hypercholesterolemia, unspecified: Secondary | ICD-10-CM

## 2024-03-17 ENCOUNTER — Encounter: Payer: Self-pay | Admitting: Podiatry

## 2024-03-17 ENCOUNTER — Ambulatory Visit: Admitting: Podiatry

## 2024-03-17 DIAGNOSIS — M79674 Pain in right toe(s): Secondary | ICD-10-CM

## 2024-03-17 DIAGNOSIS — L84 Corns and callosities: Secondary | ICD-10-CM | POA: Diagnosis not present

## 2024-03-17 DIAGNOSIS — M79675 Pain in left toe(s): Secondary | ICD-10-CM | POA: Diagnosis not present

## 2024-03-17 DIAGNOSIS — B351 Tinea unguium: Secondary | ICD-10-CM | POA: Diagnosis not present

## 2024-03-17 DIAGNOSIS — M2041 Other hammer toe(s) (acquired), right foot: Secondary | ICD-10-CM

## 2024-03-17 NOTE — Progress Notes (Signed)
 This patient presents to the office with chief complaint of long thick painful nails.  Patient says the nails are painful walking and wearing shoes.  This patient is unable to self treat.  This patient is unable to trim her nails since she is unable to reach her nails.  She presents to the office for preventative foot care services.  General Appearance  Alert, conversant and in no acute stress.  Vascular  Dorsalis pedis and posterior tibial  pulses are palpable  bilaterally.  Capillary return is within normal limits  bilaterally. Temperature is within normal limits  bilaterally.  Neurologic  Senn-Weinstein monofilament wire test within normal limits  bilaterally. Muscle power within normal limits bilaterally.  Nails Thick disfigured discolored nails with subungual debris  from hallux to fifth toes bilaterally. No evidence of bacterial infection or drainage bilaterally.  Orthopedic  No limitations of motion  feet .  No crepitus or effusions noted. 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  Skin  normotropic skin with no porokeratosis noted bilaterally.  No signs of infections or ulcers noted.   Callus second toe right foot.  Onychomycosis  Nails  B/L.  Pain in right toes  Pain in left toes  Callus right foot.  Debridement of nails both feet with nail nipper  followed by trimming the nails with dremel tool.  Debride callus with # 15 blade and dremel tool.  RTC 3 months.   Cordella Bold DPM

## 2024-04-09 ENCOUNTER — Other Ambulatory Visit: Payer: Self-pay | Admitting: Orthopedic Surgery

## 2024-04-09 DIAGNOSIS — G8929 Other chronic pain: Secondary | ICD-10-CM

## 2024-04-17 ENCOUNTER — Ambulatory Visit: Admitting: Orthopedic Surgery

## 2024-06-16 ENCOUNTER — Ambulatory Visit: Admitting: Podiatry
